# Patient Record
Sex: Female | Born: 2000 | Race: Black or African American | Hispanic: No | Marital: Single | Smoking: Never smoker
Health system: Southern US, Community
[De-identification: ages and names within clinical notes are randomized; demographics above are authoritative.]

## PROBLEM LIST (undated history)

## (undated) DIAGNOSIS — F319 Bipolar disorder, unspecified: Secondary | ICD-10-CM

## (undated) DIAGNOSIS — R51 Headache: Secondary | ICD-10-CM

## (undated) DIAGNOSIS — F329 Major depressive disorder, single episode, unspecified: Secondary | ICD-10-CM

## (undated) DIAGNOSIS — E669 Obesity, unspecified: Secondary | ICD-10-CM

## (undated) DIAGNOSIS — R519 Headache, unspecified: Secondary | ICD-10-CM

## (undated) DIAGNOSIS — F909 Attention-deficit hyperactivity disorder, unspecified type: Secondary | ICD-10-CM

## (undated) DIAGNOSIS — F32A Depression, unspecified: Secondary | ICD-10-CM

## (undated) DIAGNOSIS — J302 Other seasonal allergic rhinitis: Secondary | ICD-10-CM

## (undated) HISTORY — DX: Headache: R51

## (undated) HISTORY — DX: Headache, unspecified: R51.9

## (undated) HISTORY — PX: NO PAST SURGERIES: SHX2092

---
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
## 2019-07-19 ENCOUNTER — Other Ambulatory Visit: Payer: Self-pay

## 2019-07-19 ENCOUNTER — Encounter: Payer: Self-pay | Admitting: Advanced Practice Midwife

## 2019-07-19 ENCOUNTER — Ambulatory Visit (INDEPENDENT_AMBULATORY_CARE_PROVIDER_SITE_OTHER): Payer: Medicaid Other | Admitting: Advanced Practice Midwife

## 2019-07-19 VITALS — BP 136/79 | HR 90 | Wt 153.4 lb

## 2019-07-19 DIAGNOSIS — Z538 Procedure and treatment not carried out for other reasons: Secondary | ICD-10-CM | POA: Diagnosis not present

## 2019-07-19 DIAGNOSIS — Z30013 Encounter for initial prescription of injectable contraceptive: Secondary | ICD-10-CM

## 2019-07-19 DIAGNOSIS — Z3202 Encounter for pregnancy test, result negative: Secondary | ICD-10-CM | POA: Diagnosis not present

## 2019-07-19 DIAGNOSIS — Z3009 Encounter for other general counseling and advice on contraception: Secondary | ICD-10-CM

## 2019-07-19 LAB — POCT URINE PREGNANCY: Preg Test, Ur: NEGATIVE

## 2019-07-19 MED ORDER — MISOPROSTOL 200 MCG PO TABS: 400.0000 ug | ORAL_TABLET | Freq: Once | ORAL | 0 refills | Status: DC

## 2019-07-19 MED ORDER — MEDROXYPROGESTERONE ACETATE 150 MG/ML IM SUSP
150.0000 mg | Freq: Once | INTRAMUSCULAR | Status: AC
  Administered 2019-07-19: 150 mg via INTRAMUSCULAR

## 2019-07-19 NOTE — Progress Notes (Signed)
Pt requests to start Depo She has unprotected IC and last IC was on Saturday.  UPT neg Office supply Depo given RD w/o difficulty

## 2019-07-19 NOTE — Progress Notes (Signed)
  GYNECOLOGY PROGRESS NOTE  History:  19 y.o. G0 presents to [facility] office today for contraceptive gyn visit. She reports no gyn concerns or problems. She does not like taking OCPs, has to take meds for her bipolar and wants to take less medicine.  She would like lighter periods or no periods with her birth control.  She denies h/a, dizziness, shortness of breath, n/v, or fever/chills.    The following portions of the patient's history were reviewed and updated as appropriate: allergies, current medications, past family history, past medical history, past social history, past surgical history and problem list.   Review of Systems:  Pertinent items are noted in HPI.   Objective:  Physical Exam Blood pressure 136/79, pulse 90, weight 153 lb 6.4 oz (69.6 kg), last menstrual period 06/28/2019. VS reviewed, nursing note reviewed,  Constitutional: well developed, well nourished, no distress HEENT: normocephalic CV: normal rate Pulm/chest wall: normal effort Breast Exam: deferred Abdomen: soft Neuro: alert and oriented x 3 Skin: warm, dry Psych: affect normal Pelvic exam: Cervix pink, visually closed, without lesion, scant white creamy discharge, vaginal walls and external genitalia normal Bimanual exam: Cervix 0/long/high, firm, anterior, neg CMT, uterus nontender, nonenlarged, adnexa without tenderness, enlargement, or mass    Assessment & Plan:  1. Birth control counseling --UPT negative - POCT urine pregnancy - medroxyPROGESTERone (DEPO-PROVERA) injection 150 mg  2. Unsuccessful attempt to insert intrauterine device (IUD) --Pt tolerated procedure well.  Unable to insert/dilate internal os of cervix in office.  --Will reschedule for insertion with Rx for Cytotec  3. Encounter for counseling regarding contraception Discussed LARCs as most effective forms of birth control.  Discussed benefits/risks of other methods.  Pt desires Liletta IUD.  Unsuccessful attempt today.  Pt  interested in having IUD placed again, will prescribe Cytotec and reschedule.  Declines Depo or OCPs today, will use condoms if sexually active until IUD placed.   Sharen Counter, CNM 12:15 PM

## 2019-07-19 NOTE — Patient Instructions (Signed)
Contraception Choices Contraception, also called birth control, refers to methods or devices that prevent pregnancy. Hormonal methods Contraceptive implant  A contraceptive implant is a thin, plastic tube that contains a hormone. It is inserted into the upper part of the arm. It can remain in place for up to 3 years. Progestin-only injections Progestin-only injections are injections of progestin, a synthetic form of the hormone progesterone. They are given every 3 months by a health care provider. Birth control pills  Birth control pills are pills that contain hormones that prevent pregnancy. They must be taken once a day, preferably at the same time each day. Birth control patch  The birth control patch contains hormones that prevent pregnancy. It is placed on the skin and must be changed once a week for three weeks and removed on the fourth week. A prescription is needed to use this method of contraception. Vaginal ring  A vaginal ring contains hormones that prevent pregnancy. It is placed in the vagina for three weeks and removed on the fourth week. After that, the process is repeated with a new ring. A prescription is needed to use this method of contraception. Emergency contraceptive Emergency contraceptives prevent pregnancy after unprotected sex. They come in pill form and can be taken up to 5 days after sex. They work best the sooner they are taken after having sex. Most emergency contraceptives are available without a prescription. This method should not be used as your only form of birth control. Barrier methods Female condom  A female condom is a thin sheath that is worn over the penis during sex. Condoms keep sperm from going inside a woman's body. They can be used with a spermicide to increase their effectiveness. They should be disposed after a single use. Female condom  A female condom is a soft, loose-fitting sheath that is put into the vagina before sex. The condom keeps sperm  from going inside a woman's body. They should be disposed after a single use. Diaphragm  A diaphragm is a soft, dome-shaped barrier. It is inserted into the vagina before sex, along with a spermicide. The diaphragm blocks sperm from entering the uterus, and the spermicide kills sperm. A diaphragm should be left in the vagina for 6-8 hours after sex and removed within 24 hours. A diaphragm is prescribed and fitted by a health care provider. A diaphragm should be replaced every 1-2 years, after giving birth, after gaining more than 15 lb (6.8 kg), and after pelvic surgery. Cervical cap  A cervical cap is a round, soft latex or plastic cup that fits over the cervix. It is inserted into the vagina before sex, along with spermicide. It blocks sperm from entering the uterus. The cap should be left in place for 6-8 hours after sex and removed within 48 hours. A cervical cap must be prescribed and fitted by a health care provider. It should be replaced every 2 years. Sponge  A sponge is a soft, circular piece of polyurethane foam with spermicide on it. The sponge helps block sperm from entering the uterus, and the spermicide kills sperm. To use it, you make it wet and then insert it into the vagina. It should be inserted before sex, left in for at least 6 hours after sex, and removed and thrown away within 30 hours. Spermicides Spermicides are chemicals that kill or block sperm from entering the cervix and uterus. They can come as a cream, jelly, suppository, foam, or tablet. A spermicide should be inserted into the   vagina with an applicator at least 10-15 minutes before sex to allow time for it to work. The process must be repeated every time you have sex. Spermicides do not require a prescription. Intrauterine contraception Intrauterine device (IUD) An IUD is a T-shaped device that is put in a woman's uterus. There are two types:  Hormone IUD.This type contains progestin, a synthetic form of the hormone  progesterone. This type can stay in place for 3-5 years.  Copper IUD.This type is wrapped in copper wire. It can stay in place for 10 years.  Permanent methods of contraception Female tubal ligation In this method, a woman's fallopian tubes are sealed, tied, or blocked during surgery to prevent eggs from traveling to the uterus. Hysteroscopic sterilization In this method, a small, flexible insert is placed into each fallopian tube. The inserts cause scar tissue to form in the fallopian tubes and block them, so sperm cannot reach an egg. The procedure takes about 3 months to be effective. Another form of birth control must be used during those 3 months. Female sterilization This is a procedure to tie off the tubes that carry sperm (vasectomy). After the procedure, the man can still ejaculate fluid (semen). Natural planning methods Natural family planning In this method, a couple does not have sex on days when the woman could become pregnant. Calendar method This means keeping track of the length of each menstrual cycle, identifying the days when pregnancy can happen, and not having sex on those days. Ovulation method In this method, a couple avoids sex during ovulation. Symptothermal method This method involves not having sex during ovulation. The woman typically checks for ovulation by watching changes in her temperature and in the consistency of cervical mucus. Post-ovulation method In this method, a couple waits to have sex until after ovulation. Summary  Contraception, also called birth control, means methods or devices that prevent pregnancy.  Hormonal methods of contraception include implants, injections, pills, patches, vaginal rings, and emergency contraceptives.  Barrier methods of contraception can include female condoms, female condoms, diaphragms, cervical caps, sponges, and spermicides.  There are two types of IUDs (intrauterine devices). An IUD can be put in a woman's uterus to  prevent pregnancy for 3-5 years.  Permanent sterilization can be done through a procedure for males, females, or both.  Natural family planning methods involve not having sex on days when the woman could become pregnant. This information is not intended to replace advice given to you by your health care provider. Make sure you discuss any questions you have with your health care provider. Document Revised: 03/26/2017 Document Reviewed: 04/26/2016 Elsevier Patient Education  2020 [employer]  

## 2019-07-24 ENCOUNTER — Emergency Department (HOSPITAL_COMMUNITY)
Admission: EM | Admit: 2019-07-24 | Discharge: 2019-07-24 | Disposition: A | Discharge status: Home / Self Care | Payer: Medicaid Other | Attending: Emergency Medicine | Admitting: Emergency Medicine

## 2019-07-24 ENCOUNTER — Encounter (HOSPITAL_COMMUNITY): Payer: Self-pay | Admitting: Emergency Medicine

## 2019-07-24 ENCOUNTER — Other Ambulatory Visit: Payer: Self-pay

## 2019-07-24 DIAGNOSIS — F329 Major depressive disorder, single episode, unspecified: Secondary | ICD-10-CM | POA: Insufficient documentation

## 2019-07-24 DIAGNOSIS — F319 Bipolar disorder, unspecified: Secondary | ICD-10-CM | POA: Insufficient documentation

## 2019-07-24 DIAGNOSIS — R4589 Other symptoms and signs involving emotional state: Secondary | ICD-10-CM | POA: Diagnosis not present

## 2019-07-24 NOTE — ED Triage Notes (Signed)
Pt presents via [facility] for evaluation of bipolar with anxiety and suicidal ideation without plan. Pt calm and cooperative at this time.

## 2019-07-24 NOTE — ED Provider Notes (Signed)
[facility] Emergency Department Provider Note MRN:  [id number]  Arrival date & time: 07/24/19     Chief Complaint   Psychiatric Evaluation   History of Present Illness   Brittany [name] is a 19 y.o. year-old female with a history of bipolar disorder presenting to the ED with chief complaint of psychiatric evaluation.  Patient explains that shortly prior to arrival she had this sudden feeling that she wanted to self-harm in the form of cutting herself.  She has done this multiple times in the past.  She denies SI, no HI, no AVH.  The feeling passed and she never wants to hurt her self.  Mother called police during this event and she was brought here for evaluation.  She denies any pain, no injuries, no drugs, no alcohol, no overdose.  Mother confirms the story.  Review of Systems  A complete 10 system review of systems was obtained and all systems are negative except as noted in the HPI and PMH.   Patient's Health History    Past Medical History:  Diagnosis Date  . ADHD (attention deficit hyperactivity disorder)   . Attention deficit hyperactivity disorder (ADHD) 08/01/2015  . Bipolar 1 disorder ([place])   . Bipolar disorder ([place])   . Depression   . Depressive disorder 08/01/2015  . Headache   . Seasonal allergies     Past Surgical History:  Procedure Laterality Date  . NO PAST SURGERIES      Family History  Adopted: Yes  Problem Relation Age of Onset  . HIV Mother        Died at 47    Social History   Socioeconomic History  . Marital status: Single    Spouse name: Not on file  . Number of children: Not on file  . Years of education: Not on file  . Highest education level: Not on file  Occupational History  . Not on file  Tobacco Use  . Smoking status: Never Smoker  . Smokeless tobacco: Never Used  Substance and Sexual Activity  . Alcohol use: No    Alcohol/week: 0.0 standard drinks  . Drug use: No  . Sexual activity: Not Currently    Other Topics Concern  . Not on file  Social History Narrative   Katie is in ninth grade at [employer]. She is struggling. She enjoys singing, dancing, shopping.   Living with her parents.   Social Determinants of Health   Financial Resource Strain:   . Difficulty of Paying Living Expenses:   Food Insecurity:   . Worried About Charity fundraiser in the Last Year:   . Arboriculturist in the Last Year:   Transportation Needs:   . Film/video editor (Medical):   Marland Kitchen Lack of Transportation (Non-Medical):   Physical Activity:   . Days of Exercise per Week:   . Minutes of Exercise per Session:   Stress:   . Feeling of Stress :   Social Connections:   . Frequency of Communication with Friends and Family:   . Frequency of Social Gatherings with Friends and Family:   . Attends Religious Services:   . Active Member of Clubs or Organizations:   . Attends Archivist Meetings:   Marland Kitchen Marital Status:   Intimate Partner Violence:   . Fear of Current or Ex-Partner:   . Emotionally Abused:   Marland Kitchen Physically Abused:   . Sexually Abused:      Physical Exam  Vitals:   07/24/19 1941  BP: (!) 105/92  Pulse: 97  Resp: 14  Temp: 99 F (37.2 C)  SpO2: 98%    CONSTITUTIONAL: Well-appearing, NAD NEURO:  Alert and oriented x 3, no focal deficits EYES:  eyes equal and reactive ENT/NECK:  no LAD, no JVD CARDIO: Regular rate, well-perfused, normal S1 and S2 PULM:  CTAB no wheezing or rhonchi GI/GU:  normal bowel sounds, non-distended, non-tender MSK/SPINE:  No gross deformities, no edema SKIN:  no rash, atraumatic PSYCH:  Appropriate speech and behavior  *Additional and/or pertinent findings included in MDM below  Diagnostic and Interventional Summary    EKG Interpretation  Date/Time:    Ventricular Rate:    PR Interval:    QRS Duration:   QT Interval:    QTC Calculation:   R Axis:     Text Interpretation:        Labs Reviewed - No data to  display  No orders to display    Medications - No data to display   Procedures  /  Critical Care Procedures  ED Course and Medical Decision Making  I have reviewed the triage vital signs, the nursing notes, and pertinent available records from the EMR.  Listed above are laboratory and imaging tests that I personally ordered, reviewed, and interpreted and then considered in my medical decision making (see below for details).      Brief feelings of wanting to self-harm but no injuries, no SI, no HI, no AVH.  Has had similar presentations.  At this point patient is not a imminent threat of harm to self or others.  I obtained collateral information from patient's mother, who confirms the story and is comfortable with patient returning home.  I contracted the patient for safety, she promises to return to the emergency department or seek help prior to acting on any thoughts of self-harm.  Has follow-up with [facility].    Elmer Sow. Pilar Plate, MD [facility] Emergency Medicine [facility] [email or web address]  Final Clinical Impressions(s) / ED Diagnoses     ICD-10-CM   1. Thoughts of self harm  R45.89     ED Discharge Orders    None       Discharge Instructions Discussed with and Provided to Patient:     Discharge Instructions     You were evaluated in the Emergency Department and after careful evaluation, we did not find any emergent condition requiring admission or further testing in the hospital.  Your exam/testing today was overall reassuring.  Please return to the Emergency Department if you experience any worsening of your condition.  We encourage you to follow up with a primary care provider.  Thank you for allowing [place] to be a part of your care.       Sabas Sous, MD 07/24/19 2031

## 2019-07-24 NOTE — Discharge Instructions (Addendum)
You were evaluated in the Emergency Department and after careful evaluation, we did not find any emergent condition requiring admission or further testing in the hospital. ° °Your exam/testing today was overall reassuring. ° °Please return to the Emergency Department if you experience any worsening of your condition.  We encourage you to follow up with a primary care provider.  Thank you for allowing us to be a part of your care. ° °

## 2019-07-31 ENCOUNTER — Other Ambulatory Visit: Payer: Self-pay

## 2019-07-31 ENCOUNTER — Emergency Department (HOSPITAL_COMMUNITY)
Admission: EM | Admit: 2019-07-31 | Discharge: 2019-08-01 | Disposition: A | Discharge status: Home / Self Care | Payer: Medicaid Other | Attending: Emergency Medicine | Admitting: Emergency Medicine

## 2019-07-31 ENCOUNTER — Encounter (HOSPITAL_COMMUNITY): Payer: Self-pay

## 2019-07-31 DIAGNOSIS — F419 Anxiety disorder, unspecified: Secondary | ICD-10-CM | POA: Diagnosis present

## 2019-07-31 DIAGNOSIS — Z79899 Other long term (current) drug therapy: Secondary | ICD-10-CM | POA: Insufficient documentation

## 2019-07-31 DIAGNOSIS — F319 Bipolar disorder, unspecified: Secondary | ICD-10-CM | POA: Diagnosis not present

## 2019-07-31 DIAGNOSIS — F3481 Disruptive mood dysregulation disorder: Secondary | ICD-10-CM

## 2019-07-31 LAB — CBC WITH DIFFERENTIAL/PLATELET
Abs Immature Granulocytes: 0.02 10*3/uL (ref 0.00–0.07)
Basophils Absolute: 0.1 10*3/uL (ref 0.0–0.1)
Basophils Relative: 1 %
Eosinophils Absolute: 0.1 10*3/uL (ref 0.0–0.5)
Eosinophils Relative: 1 %
HCT: 42.5 % (ref 36.0–46.0)
Hemoglobin: 13.6 g/dL (ref 12.0–15.0)
Immature Granulocytes: 0 %
Lymphocytes Relative: 39 %
Lymphs Abs: 2.5 10*3/uL (ref 0.7–4.0)
MCH: 28.5 pg (ref 26.0–34.0)
MCHC: 32 g/dL (ref 30.0–36.0)
MCV: 89.1 fL (ref 80.0–100.0)
Monocytes Absolute: 0.5 10*3/uL (ref 0.1–1.0)
Monocytes Relative: 8 %
Neutro Abs: 3.4 10*3/uL (ref 1.7–7.7)
Neutrophils Relative %: 51 %
Platelets: 346 10*3/uL (ref 150–400)
RBC: 4.77 MIL/uL (ref 3.87–5.11)
RDW: 14.9 % (ref 11.5–15.5)
WBC: 6.6 10*3/uL (ref 4.0–10.5)
nRBC: 0 % (ref 0.0–0.2)

## 2019-07-31 LAB — COMPREHENSIVE METABOLIC PANEL
ALT: 15 U/L (ref 0–44)
AST: 17 U/L (ref 15–41)
Albumin: 4.6 g/dL (ref 3.5–5.0)
Alkaline Phosphatase: 54 U/L (ref 38–126)
Anion gap: 10 (ref 5–15)
BUN: 12 mg/dL (ref 6–20)
CO2: 20 mmol/L — ABNORMAL LOW (ref 22–32)
Calcium: 9.3 mg/dL (ref 8.9–10.3)
Chloride: 109 mmol/L (ref 98–111)
Creatinine, Ser: 0.7 mg/dL (ref 0.44–1.00)
GFR calc Af Amer: >60 mL/min (ref >60)
GFR calc non Af Amer: >60 mL/min (ref >60)
Glucose, Bld: 105 mg/dL — ABNORMAL HIGH (ref 70–99)
Potassium: 3.8 mmol/L (ref 3.5–5.1)
Sodium: 139 mmol/L (ref 135–145)
Total Bilirubin: 0.3 mg/dL (ref 0.3–1.2)
Total Protein: 8 g/dL (ref 6.5–8.1)

## 2019-07-31 LAB — RAPID URINE DRUG SCREEN, HOSP PERFORMED
Amphetamines: NOT DETECTED
Barbiturates: NOT DETECTED
Benzodiazepines: NOT DETECTED
Cocaine: NOT DETECTED
Opiates: NOT DETECTED
Tetrahydrocannabinol: NOT DETECTED

## 2019-07-31 LAB — ETHANOL: Alcohol, Ethyl (B): <10 mg/dL (ref <10)

## 2019-07-31 LAB — I-STAT BETA HCG BLOOD, ED (MC, WL, AP ONLY): I-stat hCG, quantitative: <5 m[IU]/mL (ref <5)

## 2019-07-31 MED ORDER — ARIPIPRAZOLE 10 MG PO TABS: 10.0000 mg | ORAL_TABLET | ORAL | Status: DC

## 2019-07-31 MED ORDER — HYDROXYZINE HCL 25 MG PO TABS: 25.0000 mg | ORAL_TABLET | Freq: Once | ORAL | Status: DC

## 2019-07-31 MED ORDER — LORAZEPAM 1 MG PO TABS: 1.0000 mg | ORAL_TABLET | ORAL | Status: DC | PRN

## 2019-07-31 MED ORDER — PROPRANOLOL HCL 20 MG PO TABS
60.0000 mg | ORAL_TABLET | Freq: Every day | ORAL | Status: DC
  Filled 2019-07-31: qty 3

## 2019-07-31 MED ORDER — TRAZODONE HCL 50 MG PO TABS
150.0000 mg | ORAL_TABLET | Freq: Every day | ORAL | Status: DC
  Filled 2019-07-31: qty 1

## 2019-07-31 MED ORDER — ACETAMINOPHEN 500 MG PO TABS
1000.0000 mg | ORAL_TABLET | Freq: Four times a day (QID) | ORAL | Status: DC | PRN
  Administered 2019-08-01: 1000 mg via ORAL
  Filled 2019-07-31: qty 2

## 2019-07-31 MED ORDER — PANTOPRAZOLE SODIUM 40 MG PO TBEC: 40.0000 mg | DELAYED_RELEASE_TABLET | Freq: Every day | ORAL | Status: DC

## 2019-07-31 NOTE — BHH Counselor (Signed)
Clinician called spoke to [facility] in triage expressing she called the TTS cart within 10 minutes however no one answered. Jean Rosenthal reported, he will find the TTS cart for pt's assessment. Clinician expressed she will call the TTS cart in 10 more minutes.    Redmond Pulling, MS, [facility], [facility] Triage Specialist [phone]

## 2019-07-31 NOTE — BHH Counselor (Signed)
Clinician to call TTS cart in 10 minutes. Spoke to [place], Charity fundraiser.    Redmond Pulling, MS, [facility], [facility] Triage Specialist [phone]

## 2019-07-31 NOTE — ED Provider Notes (Signed)
[facility]-EMERGENCY DEPT Provider Note   CSN: [id number] Arrival date & time: 07/31/19  1855     History Chief Complaint  Patient presents with  . Anxiety    DEEANNA BEIGHTOL is a 19 y.o. female hx of ADHD, bipolar, here presenting with anxiety, depressed mood .  Patient was seen in the ED about a week ago for suicidal ideation.  Patient at that time was able to contract for safety.  Patient was told to follow-up with [facility] but she did not.  Patient apparently went to a friend's house and then came back today and had racing thoughts. Patient states that she is very sad but denies any obvious suicidal ideations. Patient states that she is feeling anxious as well.  Patient has previous psychiatric admissions.  Patient states that she is compliant with her medicines and denies any overdose.  Patient also has some chronic abdominal pain and had recent GI evaluation and thought to have hemorrhoids.    I discussed with the mother, she states that her daughter has not been straightforward with her answers.  She is concerned that she is becoming more manic requesting psychiatric evaluation.  The history is provided by the patient.       Past Medical History:  Diagnosis Date  . ADHD (attention deficit hyperactivity disorder)   . Attention deficit hyperactivity disorder (ADHD) 08/01/2015  . Bipolar 1 disorder ([facility])   . Bipolar disorder ([facility])   . Depression   . Depressive disorder 08/01/2015  . Headache   . Seasonal allergies     Patient Active Problem List   Diagnosis Date Noted  . Defiant behavior 07/06/2018  . Attention-deficit hyperactivity disorder, predominantly hyperactive type   . Homicidal ideation   . Attention deficit hyperactivity disorder (ADHD) 08/01/2015  . Depressive disorder 08/01/2015  . DMDD (disruptive mood dysregulation disorder) ([facility]) 07/26/2015  . Tension headache 05/23/2014  . Depression 05/23/2014  . Anxiety state 05/23/2014     Past Surgical History:  Procedure Laterality Date  . NO PAST SURGERIES       OB History   No obstetric history on file.     Family History  Adopted: Yes  Problem Relation Age of Onset  . HIV Mother        Died at 34    Social History   Tobacco Use  . Smoking status: Never Smoker  . Smokeless tobacco: Never Used  Substance Use Topics  . Alcohol use: No    Alcohol/week: 0.0 standard drinks  . Drug use: No    Home Medications Prior to Admission medications   Medication Sig Start Date End Date Taking? Authorizing Provider  acetaminophen (TYLENOL) 500 MG tablet Take 1,000 mg by mouth every 6 (six) hours as needed for mild pain or moderate pain.    [provider]  ARIPiprazole (ABILIFY) 10 MG tablet Take 10 mg by mouth every morning.    [provider]  ARIPiprazole (ABILIFY) 20 MG tablet Take 20 mg by mouth at bedtime.  04/29/18   [provider]  cholecalciferol (VITAMIN D3) 25 MCG (1000 UT) tablet Take 1,000 Units by mouth daily.    [provider]  clindamycin-benzoyl peroxide (BENZACLIN) gel Apply 1 application topically every evening. 04/28/18   [provider]  cyclobenzaprine (FLEXERIL) 5 MG tablet Take 1 tablet (5 mg total) by mouth 3 (three) times daily as needed for muscle spasms. Patient not taking: Reported on 07/19/2019 06/23/18   Charlynne Pander, MD  lithium carbonate 300 MG capsule Take 600 mg by mouth 2 (two) times daily with a meal.     [provider]  Methylphenidate HCl ER, XR, (APTENSIO XR) 30 MG CP24 Take 30 mg by mouth every morning.    [provider]  misoprostol (CYTOTEC) 200 MCG tablet Take 2 tablets (400 mcg total) by mouth once for 1 dose. Take both tablets by mouth the night before your procedure. 07/19/19 07/19/19  Leftwich-Kirby, Wilmer Floor, CNM  Multiple Vitamins-Minerals (MULTIVITAMIN GUMMIES ADULT PO) Take 1 tablet by mouth every morning.    [provider]  naproxen  (NAPROSYN) 500 MG tablet Take 1 tablet (500 mg total) by mouth 2 (two) times daily with a meal. As needed for pain Patient not taking: Reported on 07/19/2019 06/07/18   Linwood Dibbles, MD  Norethindrone-Ethinyl Estradiol-Fe Biphas (LO LOESTRIN FE) 1 MG-10 MCG / 10 MCG tablet Take 1 tablet by mouth daily. Patient not taking: Reported on 07/19/2019 03/01/18   Sharen Counter A, CNM  pantoprazole (PROTONIX) 40 MG tablet Take 40 mg by mouth daily after breakfast.    [provider]  propranolol (INDERAL) 60 MG tablet Take 60 mg by mouth at bedtime.    [provider]  traZODone (DESYREL) 150 MG tablet Take 150 mg by mouth at bedtime. 04/16/18   [provider]    Allergies    Patient has no known allergies.  Review of Systems   Review of Systems  Psychiatric/Behavioral: Positive for dysphoric mood. The patient is nervous/anxious.   All other systems reviewed and are negative.   Physical Exam Updated Vital Signs BP 135/70 (BP Location: Left Arm)   Pulse 76   Temp 98.6 F (37 C) (Oral)   Resp 16   SpO2 98%   Physical Exam Vitals and nursing note reviewed.  Constitutional:      Comments: Anxious, depressed   HENT:     Head: Normocephalic.     Nose: Nose normal.     Mouth/Throat:     Mouth: Mucous membranes are moist.  Eyes:     Extraocular Movements: Extraocular movements intact.     Pupils: Pupils are equal, round, and reactive to light.  Cardiovascular:     Rate and Rhythm: Normal rate and regular rhythm.     Pulses: Normal pulses.     Heart sounds: Normal heart sounds.  Pulmonary:     Effort: Pulmonary effort is normal.     Breath sounds: Normal breath sounds.  Abdominal:     General: Abdomen is flat.     Palpations: Abdomen is soft.  Musculoskeletal:     Cervical back: Normal range of motion.  Skin:    General: Skin is warm.  Neurological:     General: No focal deficit present.  Psychiatric:     Comments: Tearful, avoid eye contact       ED Results / Procedures / Treatments   Labs (all labs ordered are listed, but only abnormal results are displayed) Labs Reviewed  CBC WITH DIFFERENTIAL/PLATELET  COMPREHENSIVE METABOLIC PANEL  ETHANOL  RAPID URINE DRUG SCREEN, HOSP PERFORMED  I-STAT BETA HCG BLOOD, ED ([facility], [facility], [facility] ONLY)    EKG None  Radiology No results found.  Procedures Procedures (including critical care time)  Medications Ordered in ED Medications  hydrOXYzine (ATARAX/VISTARIL) tablet 25 mg (has no administration in time range)    ED Course  I have reviewed the triage vital signs and the nursing notes.  Pertinent labs & imaging  results that were available during my care of the patient were reviewed by me and considered in my medical decision making (see chart for details).    MDM Rules/Calculators/A&P                      IXCHEL DUCK is a 19 y.o. female here presenting with anxiety, chronic abdominal pain, flights of ideas.  Patient's abdominal pain is chronic and has been evaluated by GI in the past .  Patient was seen here about a week ago for anxiety and suicidal ideation. She denies any suicidal ideation right now but she seemed very vague with her answers and mother was concerned that she may actually harm herself if she is sent home.  Will get psych clearance labs and consult TTS.   11:28 PM TTS recommend observation overnight and will reassess in the morning.  Psych clearance labs unremarkable.  Home meds ordered.    Final Clinical Impression(s) / ED Diagnoses Final diagnoses:  None    Rx / DC Orders ED Discharge Orders    None       Drenda Freeze, MD 07/31/19 2328

## 2019-07-31 NOTE — ED Triage Notes (Signed)
Pt reports worsening anxiety today. Denies SI/HI. Tearful during triage. States that her thoughts are racing.
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
## 2019-07-31 NOTE — BHH Counselor (Signed)
Clinician called TTS cart within 10 minutes however no one answered.    Redmond Pulling, MS, [facility], [facility] Triage Specialist [phone]

## 2019-08-01 MED ORDER — ACETAMINOPHEN 325 MG PO TABS: 650.0000 mg | ORAL_TABLET | Freq: Once | ORAL | Status: DC

## 2019-08-01 NOTE — BH Assessment (Signed)
BHH Assessment Progress Note  Per Nelly Rout, MD, this pt does not require psychiatric hospitalization at this time.  Pt is to be discharged from [facility] with recommendation to continue treatment with pt's current outpatient provider.  This has been included in pt's discharge instructions.  Pt's nurse has been notified.  Doylene Canning, MA Triage Specialist [phone]

## 2019-08-01 NOTE — ED Notes (Signed)
Pt refused ordered medication. She states that she "doesn't take that anymore."

## 2019-08-01 NOTE — Discharge Instructions (Signed)
The psychiatry team felt you are safe for discharge home today.  Please follow-up with your outpatient psychiatry team for further management.  If any symptoms change or worsen, please return to nearest emergency department.

## 2019-08-01 NOTE — Consult Note (Signed)
                                          Psychiatric note for discharge  Patient is a 19 year old female who presents to the ED for increased anxiety.  Patient reports that she is doing better with anxiety, was not suicidal when she presented to the ED, is not homicidal or psychotic.  Patient reports that she lives with her parents, feels she is back at her baseline can return home.  She also adds that she sees an outpatient psychiatrist in [place], takes her medications as prescribed and is okay with returning back to her psychiatrist.  Patient to be discharged in care of parents

## 2019-08-01 NOTE — ED Provider Notes (Signed)
Psychiatry culture report that patient is now psychiatrically clear.  They feel she is safe for discharge home with outpatient follow-up.  They requested we get the patient ready for discharge.  Patient be discharged for outpatient follow-up for mental health problems.  When I assessed the patient personally, she had no complaints and breath sounds were clear and chest and back were nontender.  Patient will be discharged for outpatient follow-up.  Clinical Impression: 1. Disruptive mood dysregulation disorder (HCC) Chronic    Disposition: Discharge  Condition: Good  I have discussed the results, Dx and Tx plan with the pt(& family if present). He/she/they expressed understanding and agree(s) with the plan. Discharge instructions discussed at great length. Strict return precautions discussed and pt &/or family have verbalized understanding of the instructions. No further questions at time of discharge.    New Prescriptions   No medications on file    Follow Up: No follow-up provider specified.    Brittany [name], Canary Brim, MD 08/01/19 1105

## 2019-08-05 ENCOUNTER — Ambulatory Visit (INDEPENDENT_AMBULATORY_CARE_PROVIDER_SITE_OTHER): Payer: Medicaid Other | Admitting: Family Medicine

## 2019-08-05 ENCOUNTER — Other Ambulatory Visit: Payer: Self-pay

## 2019-08-05 VITALS — BP 127/84 | HR 96 | Ht 62.0 in | Wt 155.1 lb

## 2019-08-05 DIAGNOSIS — Z30430 Encounter for insertion of intrauterine contraceptive device: Secondary | ICD-10-CM | POA: Diagnosis not present

## 2019-08-05 DIAGNOSIS — Z3202 Encounter for pregnancy test, result negative: Secondary | ICD-10-CM | POA: Diagnosis not present

## 2019-08-05 LAB — POCT URINE PREGNANCY: Preg Test, Ur: NEGATIVE

## 2019-08-05 MED ORDER — LEVONORGESTREL 19.5 MCG/DAY IU IUD: INTRAUTERINE_SYSTEM | Freq: Once | INTRAUTERINE | Status: AC

## 2019-08-05 NOTE — Progress Notes (Signed)
    GYNECOLOGY OFFICE PROCEDURE NOTE  Brittany [name] is a 19 y.o. No obstetric history on file. here for Liletta IUD insertion. No GYN concerns.  IUD Insertion Procedure Note Patient identified, informed consent performed, consent signed.   Discussed risks of irregular bleeding, cramping, infection, malpositioning or misplacement of the IUD outside the uterus which may require further procedure such as laparoscopy. Also discussed >99% contraception efficacy, increased risk of ectopic pregnancy with failure of method.  Time out was performed. Urine pregnancy test negative.  Speculum placed in the vagina.  Cervix visualized. Cleaned with Betadine x 2.  Grasped anteriorly with a single tooth tenaculum. Uterus sounded to 8.5 cm.  Liletta IUD placed per manufacturer's recommendations. Strings trimmed to 3 cm. Tenaculum was removed, good hemostasis noted.Patient tolerated procedure well.   Patient was given post-procedure instructions. She was advised to have backup contraception for one week.  Patient was also asked to check IUD strings periodically and follow up in 4 weeks for IUD check.  [name], MD 08/05/2019 11:42 AM

## 2019-08-05 NOTE — Progress Notes (Signed)
upt 

## 2019-08-05 NOTE — Patient Instructions (Signed)

## 2019-08-15 ENCOUNTER — Emergency Department (HOSPITAL_COMMUNITY): Payer: Medicaid Other

## 2019-08-15 ENCOUNTER — Emergency Department (HOSPITAL_COMMUNITY)
Admission: EM | Admit: 2019-08-15 | Discharge: 2019-08-16 | Disposition: A | Discharge status: Home / Self Care | Payer: Medicaid Other | Attending: Emergency Medicine | Admitting: Emergency Medicine

## 2019-08-15 ENCOUNTER — Other Ambulatory Visit: Payer: Self-pay

## 2019-08-15 ENCOUNTER — Encounter (HOSPITAL_COMMUNITY): Payer: Self-pay | Admitting: Emergency Medicine

## 2019-08-15 DIAGNOSIS — Z79899 Other long term (current) drug therapy: Secondary | ICD-10-CM | POA: Insufficient documentation

## 2019-08-15 DIAGNOSIS — F909 Attention-deficit hyperactivity disorder, unspecified type: Secondary | ICD-10-CM | POA: Diagnosis not present

## 2019-08-15 DIAGNOSIS — R102 Pelvic and perineal pain: Secondary | ICD-10-CM | POA: Insufficient documentation

## 2019-08-15 DIAGNOSIS — N939 Abnormal uterine and vaginal bleeding, unspecified: Secondary | ICD-10-CM | POA: Diagnosis not present

## 2019-08-15 LAB — COMPREHENSIVE METABOLIC PANEL
ALT: 12 U/L (ref 0–44)
AST: 14 U/L — ABNORMAL LOW (ref 15–41)
Albumin: 5.3 g/dL — ABNORMAL HIGH (ref 3.5–5.0)
Alkaline Phosphatase: 61 U/L (ref 38–126)
Anion gap: 13 (ref 5–15)
BUN: 18 mg/dL (ref 6–20)
CO2: 22 mmol/L (ref 22–32)
Calcium: 9.8 mg/dL (ref 8.9–10.3)
Chloride: 106 mmol/L (ref 98–111)
Creatinine, Ser: 1.02 mg/dL — ABNORMAL HIGH (ref 0.44–1.00)
GFR calc Af Amer: >60 mL/min (ref >60)
GFR calc non Af Amer: >60 mL/min (ref >60)
Glucose, Bld: 76 mg/dL (ref 70–99)
Potassium: 4.1 mmol/L (ref 3.5–5.1)
Sodium: 141 mmol/L (ref 135–145)
Total Bilirubin: 0.8 mg/dL (ref 0.3–1.2)
Total Protein: 9.2 g/dL — ABNORMAL HIGH (ref 6.5–8.1)

## 2019-08-15 LAB — CBC
HCT: 45.5 % (ref 36.0–46.0)
Hemoglobin: 14.5 g/dL (ref 12.0–15.0)
MCH: 28.4 pg (ref 26.0–34.0)
MCHC: 31.9 g/dL (ref 30.0–36.0)
MCV: 89 fL (ref 80.0–100.0)
Platelets: 348 10*3/uL (ref 150–400)
RBC: 5.11 MIL/uL (ref 3.87–5.11)
RDW: 14.5 % (ref 11.5–15.5)
WBC: 7.9 10*3/uL (ref 4.0–10.5)
nRBC: 0 % (ref 0.0–0.2)

## 2019-08-15 LAB — URINALYSIS, ROUTINE W REFLEX MICROSCOPIC
Bilirubin Urine: NEGATIVE
Glucose, UA: NEGATIVE mg/dL
Ketones, ur: 80 mg/dL — AB
Nitrite: NEGATIVE
Protein, ur: 100 mg/dL — AB
RBC / HPF: >50 RBC/hpf — ABNORMAL HIGH (ref 0–5)
Specific Gravity, Urine: 1.03 (ref 1.005–1.030)
pH: 6 (ref 5.0–8.0)

## 2019-08-15 LAB — HCG, QUANTITATIVE, PREGNANCY: hCG, Beta Chain, Quant, S: <1 m[IU]/mL (ref <5)

## 2019-08-15 LAB — LIPASE, BLOOD: Lipase: 35 U/L (ref 11–51)

## 2019-08-15 MED ORDER — MEGESTROL ACETATE 20 MG PO TABS
20.0000 mg | ORAL_TABLET | Freq: Every day | ORAL | 0 refills | Status: AC
Start: 2019-08-15 — End: 2019-08-20

## 2019-08-15 MED ORDER — HYDROCODONE-ACETAMINOPHEN 5-325 MG PO TABS
1.0000 | ORAL_TABLET | Freq: Once | ORAL | Status: AC
  Administered 2019-08-15: 1 via ORAL
  Filled 2019-08-15: qty 1

## 2019-08-15 MED ORDER — SODIUM CHLORIDE 0.9% FLUSH: 3.0000 mL | Freq: Once | INTRAVENOUS | Status: DC

## 2019-08-15 NOTE — ED Provider Notes (Signed)
[facility]-EMERGENCY DEPT Provider Note   CSN: [id number] Arrival date & time: 08/15/19  1433     History Chief Complaint  Patient presents with  . Vaginal Bleeding    Brittany [name] is a 19 y.o. female.  19 year old female presents with complaint of left lower quadrant pain with vaginal bleeding.  Patient states her symptoms started on April 23 after placement of an IUD.  Pain radiates across her low pelvis, is constant, reports bleeding through 1 pad every hour with clots.  Denies history of STDs or concern for possible STD today.  Denies nausea, vomiting, changes in bowel or bladder habits.  She is taking naproxen and Tylenol without improvement in her pain.  No other complaints or concerns today.        Past Medical History:  Diagnosis Date  . ADHD (attention deficit hyperactivity disorder)   . Attention deficit hyperactivity disorder (ADHD) 08/01/2015  . Bipolar 1 disorder (HCC)   . Bipolar disorder (HCC)   . Depression   . Depressive disorder 08/01/2015  . Headache   . Seasonal allergies     Patient Active Problem List   Diagnosis Date Noted  . Defiant behavior 07/06/2018  . Attention-deficit hyperactivity disorder, predominantly hyperactive type   . Homicidal ideation   . Attention deficit hyperactivity disorder (ADHD) 08/01/2015  . Depressive disorder 08/01/2015  . DMDD (disruptive mood dysregulation disorder) (HCC) 07/26/2015  . Tension headache 05/23/2014  . Depression 05/23/2014  . Anxiety state 05/23/2014    Past Surgical History:  Procedure Laterality Date  . NO PAST SURGERIES       OB History   No obstetric history on file.     Family History  Adopted: Yes  Problem Relation Age of Onset  . HIV Mother        Died at 51    Social History   Tobacco Use  . Smoking status: Never Smoker  . Smokeless tobacco: Never Used  Substance Use Topics  . Alcohol use: No    Alcohol/week: 0.0 standard drinks  . Drug use: No     Home Medications Prior to Admission medications   Medication Sig Start Date End Date Taking? Authorizing Provider  ARIPiprazole (ABILIFY) 20 MG tablet Take 20 mg by mouth at bedtime.  04/29/18  Yes [provider]  lamoTRIgine (LAMICTAL) 25 MG tablet Take 50 mg by mouth daily.  06/07/19  Yes [provider]  omeprazole (PRILOSEC) 10 MG capsule Take 10 mg by mouth daily.   Yes [provider]  QUEtiapine (SEROQUEL) 100 MG tablet Take 100 mg by mouth at bedtime.  06/06/19  Yes [provider]  cyclobenzaprine (FLEXERIL) 5 MG tablet Take 1 tablet (5 mg total) by mouth 3 (three) times daily as needed for muscle spasms. Patient not taking: Reported on 07/19/2019 06/23/18   Charlynne Pander, MD  megestrol (MEGACE) 20 MG tablet Take 1 tablet (20 mg total) by mouth daily for 5 days. 08/15/19 08/20/19  Jeannie Fend, PA-C  misoprostol (CYTOTEC) 200 MCG tablet Take 2 tablets (400 mcg total) by mouth once for 1 dose. Take both tablets by mouth the night before your procedure. Patient not taking: Reported on 08/16/2019 07/19/19 07/19/19  Leftwich-Kirby, Wilmer Floor, CNM  naproxen (NAPROSYN) 500 MG tablet Take 1 tablet (500 mg total) by mouth 2 (two) times daily with a meal. As needed for pain Patient not taking: Reported on 07/19/2019 06/07/18   Linwood Dibbles, MD  Norethindrone-Ethinyl Estradiol-Fe Biphas (LO  LOESTRIN FE) 1 MG-10 MCG / 10 MCG tablet Take 1 tablet by mouth daily. Patient not taking: Reported on 08/05/2019 03/01/18   Elvera Maria, CNM    Allergies    Patient has no known allergies.  Review of Systems   Review of Systems  Constitutional: Negative for fever.  Respiratory: Negative for shortness of breath.   Cardiovascular: Negative for chest pain.  Gastrointestinal: Positive for abdominal pain. Negative for constipation, diarrhea, nausea and vomiting.  Genitourinary: Positive for pelvic pain and vaginal bleeding. Negative for dysuria, frequency and vaginal  discharge.  Musculoskeletal: Negative for arthralgias, back pain and myalgias.  Skin: Negative for rash and wound.  Allergic/Immunologic: Negative for immunocompromised state.  Neurological: Negative for dizziness and weakness.  Hematological: Negative for adenopathy.  All other systems reviewed and are negative.   Physical Exam Updated Vital Signs BP 130/67   Pulse 75   Temp 99.6 F (37.6 C) (Oral)   Resp 16   SpO2 100%   Physical Exam Vitals and nursing note reviewed. Exam conducted with a chaperone present.  Constitutional:      General: She is not in acute distress.    Appearance: She is well-developed. She is not diaphoretic.  HENT:     Head: Normocephalic and atraumatic.  Cardiovascular:     Rate and Rhythm: Normal rate and regular rhythm.     Pulses: Normal pulses.     Heart sounds: Normal heart sounds.  Pulmonary:     Effort: Pulmonary effort is normal.     Breath sounds: Normal breath sounds.  Abdominal:     Palpations: Abdomen is soft.     Tenderness: There is abdominal tenderness in the suprapubic area, left upper quadrant and left lower quadrant. There is no right CVA tenderness or left CVA tenderness.  Genitourinary:    Adnexa:        Right: No mass.         Left: No mass.       Comments: Trace amount of bleeding from os, IUD string visible.  Generalized/diffuse pelvic pain. Skin:    General: Skin is warm and dry.     Findings: No erythema or rash.  Neurological:     Mental Status: She is alert and oriented to person, place, and time.  Psychiatric:        Behavior: Behavior normal.     ED Results / Procedures / Treatments   Labs (all labs ordered are listed, but only abnormal results are displayed) Labs Reviewed  WET PREP, GENITAL - Abnormal; Notable for the following components:      Result Value   Clue Cells Wet Prep HPF POC PRESENT (*)    WBC, Wet Prep HPF POC MODERATE (*)    All other components within normal limits  COMPREHENSIVE METABOLIC  PANEL - Abnormal; Notable for the following components:   Creatinine, Ser 1.02 (*)    Total Protein 9.2 (*)    Albumin 5.3 (*)    AST 14 (*)    All other components within normal limits  URINALYSIS, ROUTINE W REFLEX MICROSCOPIC - Abnormal; Notable for the following components:   APPearance HAZY (*)    Hgb urine dipstick LARGE (*)    Ketones, ur 80 (*)    Protein, ur 100 (*)    Leukocytes,Ua TRACE (*)    RBC / HPF >50 (*)    Bacteria, UA RARE (*)    All other components within normal limits  LIPASE, BLOOD  CBC  HCG, QUANTITATIVE, PREGNANCY  GC/CHLAMYDIA PROBE AMP ([place]) NOT AT [facility]    EKG None  Radiology US Transvaginal Non-OB  Result Date: 08/15/2019 CLINICAL DATA:  Left lower quadrant pain and heavy bleeding EXAM: TRANSABDOMINAL ULTRASOUND OF PELVIS DOPPLER ULTRASOUND OF OVARIES TECHNIQUE: Transabdominal ultrasound examination of the pelvis was performed including evaluation of the uterus, ovaries, adnexal regions, and pelvic cul-de-sac. Color and duplex Doppler ultrasound was utilized to evaluate blood flow to the ovaries. COMPARISON:  None. FINDINGS: Uterus Measurements: 6.6 x 3.0 x 4.1 cm = volume: 42.2 mL. No fibroids or other mass visualized. Endometrium Thickness: 1 cm.  IUD seen within the endometrial canal. Right ovary Measurements: 3.1 x 2.4 x 2.0 cm = volume: 10.2 mL. Normal appearance/no adnexal mass. Left ovary Measurements: 3.4 x 1.7 x 2.3 cm = volume: 6.8 mL. Normal appearance/no adnexal mass. Pulsed Doppler evaluation demonstrates normal low-resistance arterial and venous waveforms in both ovaries. Other: IMPRESSION: Endometrium measuring 1 cm. IUD within the endometrial canal. Normal appearing ovaries. Electronically Signed   By: Jonna Clark M.D.   On: 08/15/2019 23:16   US Pelvis Complete  Result Date: 08/15/2019 CLINICAL DATA:  Left lower quadrant pain and heavy bleeding EXAM: TRANSABDOMINAL ULTRASOUND OF PELVIS DOPPLER ULTRASOUND OF OVARIES TECHNIQUE:  Transabdominal ultrasound examination of the pelvis was performed including evaluation of the uterus, ovaries, adnexal regions, and pelvic cul-de-sac. Color and duplex Doppler ultrasound was utilized to evaluate blood flow to the ovaries. COMPARISON:  None. FINDINGS: Uterus Measurements: 6.6 x 3.0 x 4.1 cm = volume: 42.2 mL. No fibroids or other mass visualized. Endometrium Thickness: 1 cm.  IUD seen within the endometrial canal. Right ovary Measurements: 3.1 x 2.4 x 2.0 cm = volume: 10.2 mL. Normal appearance/no adnexal mass. Left ovary Measurements: 3.4 x 1.7 x 2.3 cm = volume: 6.8 mL. Normal appearance/no adnexal mass. Pulsed Doppler evaluation demonstrates normal low-resistance arterial and venous waveforms in both ovaries. Other: IMPRESSION: Endometrium measuring 1 cm. IUD within the endometrial canal. Normal appearing ovaries. Electronically Signed   By: Jonna Clark M.D.   On: 08/15/2019 23:16   [place] Art/Ven Flow Abd Pelv Doppler  Result Date: 08/15/2019 CLINICAL DATA:  Left lower quadrant pain and heavy bleeding EXAM: TRANSABDOMINAL ULTRASOUND OF PELVIS DOPPLER ULTRASOUND OF OVARIES TECHNIQUE: Transabdominal ultrasound examination of the pelvis was performed including evaluation of the uterus, ovaries, adnexal regions, and pelvic cul-de-sac. Color and duplex Doppler ultrasound was utilized to evaluate blood flow to the ovaries. COMPARISON:  None. FINDINGS: Uterus Measurements: 6.6 x 3.0 x 4.1 cm = volume: 42.2 mL. No fibroids or other mass visualized. Endometrium Thickness: 1 cm.  IUD seen within the endometrial canal. Right ovary Measurements: 3.1 x 2.4 x 2.0 cm = volume: 10.2 mL. Normal appearance/no adnexal mass. Left ovary Measurements: 3.4 x 1.7 x 2.3 cm = volume: 6.8 mL. Normal appearance/no adnexal mass. Pulsed Doppler evaluation demonstrates normal low-resistance arterial and venous waveforms in both ovaries. Other: IMPRESSION: Endometrium measuring 1 cm. IUD within the endometrial canal. Normal  appearing ovaries. Electronically Signed   By: Jonna Clark M.D.   On: 08/15/2019 23:16    Procedures Procedures (including critical care time)  Medications Ordered in ED Medications  sodium chloride flush (NS) 0.9 % injection 3 mL (has no administration in time range)  megestrol (MEGACE) tablet 20 mg (has no administration in time range)  HYDROcodone-acetaminophen (NORCO/VICODIN) 5-325 MG per tablet 1 tablet (1 tablet Oral Given 08/15/19 2243)  megestrol (MEGACE) tablet 20 mg (20 mg Oral Given  08/16/19 0141)    ED Course  I have reviewed the triage vital signs and the nursing notes.  Pertinent labs & imaging results that were available during my care of the patient were reviewed by me and considered in my medical decision making (see chart for details).  Clinical Course as of Aug 15 144  Tue Aug 16, 2019  5443 19 year old female with complaint of vaginal bleeding since April 23 when IUD was placed.  On exam, small amount of active bleeding, no clots, no hemorrhage or significant bleeding at this time.  CBC reviewed, normal H&H.  Remaining labs without significant findings, suspect urinalysis is contaminated.  Patient is not pregnant.  Wet prep is positive for clue cells, no complaints at this time.   [LM]  0145 Ultrasound is negative for torsion, does show endometrium at 1 cm, IUD in place. Patient was given dose of Megace, discharged with prescription for same and advised to contact her gynecologist for follow-up.   [LM]    Clinical Course User Index [LM] Alden Hipp   MDM Rules/Calculators/A&P                      Final Clinical Impression(s) / ED Diagnoses Final diagnoses:  Vaginal bleeding    Rx / DC Orders ED Discharge Orders         Ordered    megestrol (MEGACE) 20 MG tablet  Daily     08/15/19 2347           Jeannie Fend, PA-C 08/16/19 0146    Melene Plan, DO 08/16/19 2259

## 2019-08-15 NOTE — Discharge Instructions (Addendum)
Take Megace daily as prescribed and follow up with your GYN office.  Megace should help slow down or stop the bleeding.

## 2019-08-15 NOTE — ED Triage Notes (Signed)
Having heavy vaginal bleeding since 4/23; IUD placed.

## 2019-08-16 LAB — GC/CHLAMYDIA PROBE AMP (~~LOC~~) NOT AT ARMC
Chlamydia: NEGATIVE
Comment: NEGATIVE
Comment: NORMAL
Neisseria Gonorrhea: NEGATIVE

## 2019-08-16 LAB — WET PREP, GENITAL
Sperm: NONE SEEN
Trich, Wet Prep: NONE SEEN
Yeast Wet Prep HPF POC: NONE SEEN

## 2019-08-16 MED ORDER — MEGESTROL ACETATE 20 MG PO TABS: 20.0000 mg | ORAL_TABLET | Freq: Every day | ORAL | Status: DC

## 2019-08-16 MED ORDER — MEGESTROL ACETATE 20 MG PO TABS
20.0000 mg | ORAL_TABLET | ORAL | Status: AC
  Administered 2019-08-16: 20 mg via ORAL
  Filled 2019-08-16: qty 1

## 2019-08-28 ENCOUNTER — Emergency Department (HOSPITAL_COMMUNITY)
Admission: EM | Admit: 2019-08-28 | Discharge: 2019-08-29 | Disposition: A | Discharge status: Home / Self Care | Payer: Medicaid Other | Attending: Emergency Medicine | Admitting: Emergency Medicine

## 2019-08-28 ENCOUNTER — Other Ambulatory Visit: Payer: Self-pay

## 2019-08-28 ENCOUNTER — Encounter (HOSPITAL_COMMUNITY): Payer: Self-pay | Admitting: Emergency Medicine

## 2019-08-28 ENCOUNTER — Emergency Department (HOSPITAL_COMMUNITY): Payer: Medicaid Other

## 2019-08-28 DIAGNOSIS — R4182 Altered mental status, unspecified: Secondary | ICD-10-CM | POA: Diagnosis present

## 2019-08-28 DIAGNOSIS — Z79899 Other long term (current) drug therapy: Secondary | ICD-10-CM | POA: Insufficient documentation

## 2019-08-28 DIAGNOSIS — F3181 Bipolar II disorder: Secondary | ICD-10-CM | POA: Diagnosis not present

## 2019-08-28 LAB — CBC WITH DIFFERENTIAL/PLATELET
Abs Immature Granulocytes: 0.02 10*3/uL (ref 0.00–0.07)
Basophils Absolute: 0.1 10*3/uL (ref 0.0–0.1)
Basophils Relative: 1 %
Eosinophils Absolute: 0.1 10*3/uL (ref 0.0–0.5)
Eosinophils Relative: 1 %
HCT: 42.3 % (ref 36.0–46.0)
Hemoglobin: 12.9 g/dL (ref 12.0–15.0)
Immature Granulocytes: 0 %
Lymphocytes Relative: 42 %
Lymphs Abs: 3.1 10*3/uL (ref 0.7–4.0)
MCH: 27.4 pg (ref 26.0–34.0)
MCHC: 30.5 g/dL (ref 30.0–36.0)
MCV: 89.8 fL (ref 80.0–100.0)
Monocytes Absolute: 0.5 10*3/uL (ref 0.1–1.0)
Monocytes Relative: 7 %
Neutro Abs: 3.7 10*3/uL (ref 1.7–7.7)
Neutrophils Relative %: 49 %
Platelets: 332 10*3/uL (ref 150–400)
RBC: 4.71 MIL/uL (ref 3.87–5.11)
RDW: 14.6 % (ref 11.5–15.5)
WBC: 7.5 10*3/uL (ref 4.0–10.5)
nRBC: 0 % (ref 0.0–0.2)

## 2019-08-28 LAB — RAPID URINE DRUG SCREEN, HOSP PERFORMED
Amphetamines: NOT DETECTED
Barbiturates: NOT DETECTED
Benzodiazepines: NOT DETECTED
Cocaine: NOT DETECTED
Opiates: NOT DETECTED
Tetrahydrocannabinol: NOT DETECTED

## 2019-08-28 LAB — COMPREHENSIVE METABOLIC PANEL
ALT: 17 U/L (ref 0–44)
AST: 15 U/L (ref 15–41)
Albumin: 4.6 g/dL (ref 3.5–5.0)
Alkaline Phosphatase: 56 U/L (ref 38–126)
Anion gap: 6 (ref 5–15)
BUN: 9 mg/dL (ref 6–20)
CO2: 25 mmol/L (ref 22–32)
Calcium: 9.1 mg/dL (ref 8.9–10.3)
Chloride: 110 mmol/L (ref 98–111)
Creatinine, Ser: 0.85 mg/dL (ref 0.44–1.00)
GFR calc Af Amer: >60 mL/min (ref >60)
GFR calc non Af Amer: >60 mL/min (ref >60)
Glucose, Bld: 99 mg/dL (ref 70–99)
Potassium: 3.9 mmol/L (ref 3.5–5.1)
Sodium: 141 mmol/L (ref 135–145)
Total Bilirubin: 0.1 mg/dL — ABNORMAL LOW (ref 0.3–1.2)
Total Protein: 7.8 g/dL (ref 6.5–8.1)

## 2019-08-28 LAB — I-STAT BETA HCG BLOOD, ED (MC, WL, AP ONLY): I-stat hCG, quantitative: <5 m[IU]/mL (ref <5)

## 2019-08-28 LAB — ETHANOL: Alcohol, Ethyl (B): <10 mg/dL (ref <10)

## 2019-08-28 NOTE — ED Provider Notes (Signed)
[facility]-EMERGENCY DEPT Provider Note   CSN: [id number] Arrival date & time: 08/28/19  2143     History Chief Complaint  Patient presents with  . Medical Clearance    Brittany [name] is a 19 y.o. female.  19 y.o female with a PMH of Bipolar 2 presents to the ED via EMS after being found in the tub by mother.  Patient is unable to provide further history, this report that she is unsure why she is here, last she remembers is that "I was in the tub I do not know ".  Will obtain more information from mother.  Denies SI, HI, visual or auditory hallucinations.  Level 5 caveat.   The history is provided by the patient and medical records.       Past Medical History:  Diagnosis Date  . ADHD (attention deficit hyperactivity disorder)   . Attention deficit hyperactivity disorder (ADHD) 08/01/2015  . Bipolar 1 disorder (HCC)   . Bipolar disorder (HCC)   . Depression   . Depressive disorder 08/01/2015  . Headache   . Seasonal allergies     Patient Active Problem List   Diagnosis Date Noted  . Defiant behavior 07/06/2018  . Attention-deficit hyperactivity disorder, predominantly hyperactive type   . Homicidal ideation   . Attention deficit hyperactivity disorder (ADHD) 08/01/2015  . Depressive disorder 08/01/2015  . DMDD (disruptive mood dysregulation disorder) (HCC) 07/26/2015  . Tension headache 05/23/2014  . Depression 05/23/2014  . Anxiety state 05/23/2014    Past Surgical History:  Procedure Laterality Date  . NO PAST SURGERIES       OB History   No obstetric history on file.     Family History  Adopted: Yes  Problem Relation Age of Onset  . HIV Mother        Died at 86    Social History   Tobacco Use  . Smoking status: Never Smoker  . Smokeless tobacco: Never Used  Substance Use Topics  . Alcohol use: No    Alcohol/week: 0.0 standard drinks  . Drug use: No    Home Medications Prior to Admission medications   Medication Sig  Start Date End Date Taking? Authorizing Provider  ARIPiprazole (ABILIFY) 20 MG tablet Take 20 mg by mouth at bedtime.  04/29/18   [provider]  cyclobenzaprine (FLEXERIL) 5 MG tablet Take 1 tablet (5 mg total) by mouth 3 (three) times daily as needed for muscle spasms. Patient not taking: Reported on 07/19/2019 06/23/18   Charlynne Pander, MD  lamoTRIgine (LAMICTAL) 25 MG tablet Take 50 mg by mouth daily.  06/07/19   [provider]  misoprostol (CYTOTEC) 200 MCG tablet Take 2 tablets (400 mcg total) by mouth once for 1 dose. Take both tablets by mouth the night before your procedure. Patient not taking: Reported on 08/16/2019 07/19/19 07/19/19  Leftwich-Kirby, Wilmer Floor, CNM  naproxen (NAPROSYN) 500 MG tablet Take 1 tablet (500 mg total) by mouth 2 (two) times daily with a meal. As needed for pain Patient not taking: Reported on 07/19/2019 06/07/18   Linwood Dibbles, MD  Norethindrone-Ethinyl Estradiol-Fe Biphas (LO LOESTRIN FE) 1 MG-10 MCG / 10 MCG tablet Take 1 tablet by mouth daily. Patient not taking: Reported on 08/05/2019 03/01/18   Hurshel Party, CNM  omeprazole (PRILOSEC) 10 MG capsule Take 10 mg by mouth daily.    [provider]  QUEtiapine (SEROQUEL) 100 MG tablet Take 100 mg by mouth at bedtime.  06/06/19  [provider]    Allergies    Patient has no known allergies.  Review of Systems   Review of Systems  Unable to perform ROS: Mental status change  Constitutional: Negative for fever.  HENT: Negative for sore throat.   Respiratory: Negative for shortness of breath.   Cardiovascular: Negative for chest pain.    Physical Exam Updated Vital Signs BP 109/78   Pulse 64   Temp 99.1 F (37.3 C) (Oral)   Resp 20   Ht 5\' 2"  (1.575 m)   Wt 52.2 kg   SpO2 99%   BMI 21.03 kg/m   Physical Exam Vitals and nursing note reviewed.  Constitutional:      Comments: Appears sleepy on exam.   HENT:     Head: Normocephalic and atraumatic.     Nose:  Nose normal.  Eyes:     Pupils: Pupils are equal, round, and reactive to light.  Cardiovascular:     Rate and Rhythm: Normal rate.  Pulmonary:     Effort: Pulmonary effort is normal.     Breath sounds: No wheezing.  Abdominal:     General: Abdomen is flat.     Tenderness: There is no abdominal tenderness.  Musculoskeletal:     Cervical back: Normal range of motion and neck supple.  Skin:    General: Skin is warm and dry.  Neurological:     Mental Status: She is alert and oriented to person, place, and time.     Motor: Motor function is intact.     Gait: Gait is intact.     Comments: No facial asymmetry, no dysarthria.  No focal deficit on my exam.  Patient was ambulatory to the bathroom with a steady gait.     ED Results / Procedures / Treatments   Labs (all labs ordered are listed, but only abnormal results are displayed) Labs Reviewed  COMPREHENSIVE METABOLIC PANEL - Abnormal; Notable for the following components:      Result Value   Total Bilirubin 0.1 (*)    All other components within normal limits  ETHANOL  RAPID URINE DRUG SCREEN, HOSP PERFORMED  CBC WITH DIFFERENTIAL/PLATELET  I-STAT BETA HCG BLOOD, ED ([facility], [facility], [facility] ONLY)    EKG None  Radiology CT Head Wo Contrast  Result Date: 08/28/2019 CLINICAL DATA:  Altered mental status, unclear call, EMS describes the patient as fully clothed in the shower with panic attack EXAM: CT HEAD WITHOUT CONTRAST TECHNIQUE: Contiguous axial images were obtained from the base of the skull through the vertex without intravenous contrast. COMPARISON:  CT head 05/10/2014 FINDINGS: Brain: No evidence of acute infarction, hemorrhage, hydrocephalus, extra-axial collection or mass lesion/mass effect. Vascular: No hyperdense vessel or unexpected calcification. Skull: No calvarial fracture or suspicious osseous lesion. No scalp swelling or hematoma. Sinuses/Orbits: Paranasal sinuses and mastoid air cells are predominantly clear. Included orbital  structures are unremarkable. Other: None IMPRESSION: Normal CT appearance of the brain. Electronically Signed   By: Lovena Le M.D.   On: 08/28/2019 23:58    Procedures Procedures (including critical care time)  Medications Ordered in ED Medications - No data to display  ED Course  I have reviewed the triage vital signs and the nursing notes.  Pertinent labs & imaging results that were available during my care of the patient were reviewed by me and considered in my medical decision making (see chart for details).    MDM Rules/Calculators/A&P   Patient with a past medical history of bipolar 2  presents to the ED via EMS after being found by mother on the bathtub with the water running.  According to mother she heard a big bump, states that patient was found on the bottom of the tub, still fully clothed with the water running.  She reports she had been getting an altercation with her yesterday.  Patient was unable to respond to mother, both brought in for altered mental status.  According to mother, most of her medication is safely locked away, has taken two trazodone yesterday, but did not ingest them. Mother reports that patient has had problems with her medication, she will likely need medication readjustment as she has not been herself lately.  She does not report any fevers or patient at home, no other complaints per mother.  11:13 PM Spoke to mother at the bedside who reports patient was found unconscious on the bathtub, she states she heard a big bump, likely somebody falling and patient did not allow her to walk into the bathroom.  She reports when she entered the bathroom, she noted patient to be laying in the bathtub with the water running, bra shorts were still on.  She reports patient had told her she was going to take a shower.  There has been some changes in behavior, they did have an argument yesterday where mother states patient was found with 2 pills of trazodone, her  medication is usually under supervision and locked.  She also reports patient was found today with 2 small blue pills?,  States she was taking the medication out of the pills and placing it on her dresser, this was then wiped off by mother and discarded.  Will obtain CT head as mother does not know if patient was out in the shower.  CBC is without any abnormalities.  CMP without any electrolyte derangement, LFTs are within normal limits.  Ethanol is normal.  Beta hCG is negative.  UDS is negative for any illicit substance.  CT Head obtained showed: Normal CT appearance of the brain.  Of note, patient is here voluntarily, has agreed to have consultation with TTS.  Mother reports that she attempted to IVC patient however has not filled out paperwork.  12:39 AM TTS consultation has been performed, they have recommended overnight observation at this time.  Patient will need to be reevaluated in the morning by TTS.  Portions of this note were generated with Scientist, clinical (histocompatibility and immunogenetics). Dictation errors may occur despite best attempts at proofreading.  Final Clinical Impression(s) / ED Diagnoses Final diagnoses:  Bipolar 2 disorder [facility])    Rx / DC Orders ED Discharge Orders    None       Claude Manges, PA-C 08/29/19 0039    Charlynne Pander, MD 08/29/19 [phone]

## 2019-08-28 NOTE — ED Triage Notes (Signed)
Pt arrived via EMS from home. EMS reports that pt was fully clothed in the shower having a panic attack when they were called out. Pt has hx of bipolar disorder. EMS reports that pt was able to walk down a flight of stairs with their help. Pt's mom is on the way per EMS.

## 2019-08-29 ENCOUNTER — Encounter (HOSPITAL_COMMUNITY): Payer: Self-pay | Admitting: Registered Nurse

## 2019-08-29 NOTE — [facility] Suicide Risk Assessment (Cosign Needed)
Suicide Risk Assessment  Discharge Assessment   [name] Discharge Suicide Risk Assessment   Principal Problem: <principal problem not specified> Discharge Diagnoses: Active Problems:   * No active hospital problems. *   Total Time spent with patient: 30 minutes  Musculoskeletal: Strength & Muscle Tone: within normal limits Gait & Station: normal Patient leans: N/A  Psychiatric Specialty Exam:   Blood pressure 126/69, pulse 79, temperature 99.1 F (37.3 C), temperature source Oral, resp. rate 18, height 5\' 2"  (1.575 m), weight 52.2 kg, SpO2 99 %.Body mass index is 21.03 kg/m.  General Appearance: Casual  Eye Contact::  Good  Speech:  Clear and Coherent409  Volume:  Normal  Mood:  "Better"  Affect:  Appropriate and Congruent  Thought Process:  Coherent, Goal Directed and Descriptions of Associations: Intact  Orientation:  Full (Time, Place, and Person)  Thought Content:  WDL  Suicidal Thoughts:  No  Homicidal Thoughts:  No  Memory:  Immediate;   Good Recent;   Good Remote;   Good  Judgement:  Intact  Insight:  Present  Psychomotor Activity:  Normal  Concentration:  Good  Recall:  Good  Fund of Knowledge:Fair  Language: Fair  Akathisia:  No  Handed:  Right  AIMS (if indicated):     Assets:  Communication Skills Desire for Improvement Housing Leisure Time Social Support  Sleep:     Cognition: WNL  ADL's:  Intact   Mental Status Per Nursing Assessment::   On Admission:    Brittany [name], 18 y.o., female patient seen via tele psych by this provider, Dr. 12-15-1974; and chart reviewed on 08/29/19.  On evaluation Brittany [name] reports she was not trying to kill herself yesterday.  Reports history or anxiety and panic attacks. Gave permission to speak to her mother for collateral information. During evaluation Brittany [name] is alert/oriented x 4; calm/cooperative; and mood is congruent with affect.  She does not appear to be responding to internal/external stimuli or  delusional thoughts.  Patient denies suicidal/self-harm/homicidal ideation, psychosis, and paranoia.  Patient answered question appropriately.  Collateral Information:  Spoke with Sylver Vantassell at [phone] (adoptive mother) who reports that patient was found in the bathtub with the water running and when she attempted to turn the water off the patient would push the stopper back down and that it had to be done twice.  States she called EMS and when arrived patient started shaking.  States that patient has history of erratic behavior.  "I think she likes this man and has been doing this to get him to come see her but he doesn't like her like that and I've talked to him and he has decided that it is best if he just doesn't see her anymore.  Mrs. Dooly informed of other impulsive decision patient has made that was not in best interest of patient.  States that she is unable to care or become patients guardian related to having a sick husband.  States that she has plans to call [employer] to see about getting a care coordinator and some assistance because she doesn't know what to do.  States that patient has been in and out of group homes and [facility] and feels that patient needs something like that now to make sure she doesn't put herself in harms way.  Currently patient is her own guardian.  Informed that would have social worker to call Renee Harder and Social worker will also respond back to her.  Patient does appear to  need a higher level of care and an guardian assigned related to patient unable to make rational or safe decisions for herself.  Mother also reports patient has a history of an intellectual disability, bipolar disorder and conduct disorder. Patient to follow up at [facility] for outpatient psychiatric services. States that she will be able to pick patient up at 1:00 PM today.      Demographic Factors:  Black female  Loss Factors: NA  Historical Factors: Impulsivity  Risk  Reduction Factors:   Religious beliefs about death, Living with another person, especially a relative and Positive social support  Continued Clinical Symptoms:  Previous Psychiatric Diagnoses and Treatments  Cognitive Features That Contribute To Risk:  None    Suicide Risk:  Minimal: No identifiable suicidal ideation.  Patients presenting with no risk factors but with morbid ruminations; may be classified as minimal risk based on the severity of the depressive symptoms    Plan Of Care/Follow-up recommendations:  Activity:  As tolerated Diet:  Heart healthy   Discharge Instructions     For your mental health needs, you are advised to continue treatment with [facility]:       [facility]      [address]      [place], [place] [address]      [phone]      Crisis number: [phone]  If for any reason you are unable to follow up with [facility], contact [facility]:       [facility]      [place], [place] [address]      [phone]      New patients are seen at their walk-in clinic.  Walk-in hours are Monday - Friday from 8:30 am - 12:00 pm, and from 1:00 pm - 2:30 pm.  Walk-in patients are seen on a first come, first served basis, so try to arrive as early as possible for the best chance of being seen the same day.     Disposition:  Psychiatrically cleared No evidence of imminent risk to self or others at present.   Patient does not meet criteria for psychiatric inpatient admission. Supportive therapy provided about ongoing stressors. Discussed crisis plan, support from social network, calling 911, coming to the Emergency Department, and calling Suicide Hotline.  Donielle Kaigler, NP 08/29/2019, 11:39 AM

## 2019-08-29 NOTE — Progress Notes (Signed)
Disposition: Nira Conn, NP recommends continued observation for safety and stabilization and to be reassessed in the AM by psych. EDP Claude Manges, PA-C and Weston Brass, RN have been advised.

## 2019-08-29 NOTE — BH Assessment (Signed)
Tele Assessment Note   Patient Name: Brittany [name] MRN: [id number] Referring Physician: Janeece Fitting, PA-C Location of Patient: [facility] Location of Provider: [place] is an 19 y.o. female who presents to the ED voluntarily. Pt reports she does not recall whey she is in the ED. Per reports, pt was found by her mother in a bathtub. Pt states she does not know the reason she was in the bathtub and does not recall any of the events that took place on 2022-07-29. Pt is oriented to self only. Pt states she takes medication for Bipolar d/o but denies any other drug or alcohol use. Pt has a hx of inpt admissions and ED visits c/o Bipolar affective d/o. Pt states she is followed by [facility] for OPT needs but states she has not been there "in a while." Pt denies HI and denies AVH. Per chart review, pt overdosed on medication in Feb 2021 and was admitted to [facility] facility. Pt reports her mother passed away in Jul 29, 2006. Pt states she is graduating from [employer] on June 5th.  Pt is alert during the assessment. Pt judgement is partial. Pt affect is flat. Pt mood is despair. Pt thought process is relevant and coherent. Pt does not appear to be responding to internal stimuli during the assessment.   TTS attempted to contact the pt's adoptive mother in order to obtain collateral information but did not receive an answer. A HIPAA compliant v/m was left requesting a call back.  Disposition: Lindon Romp, NP recommends continued observation for safety and stabilization and to be reassessed in the AM by psych. EDP Janeece Fitting, PA-C and Fanny Bien, RN have been advised.   Diagnosis: Bipolar affective d/o  Past Medical History:  Past Medical History:  Diagnosis Date  . ADHD (attention deficit hyperactivity disorder)   . Attention deficit hyperactivity disorder (ADHD) 08/01/2015  . Bipolar 1 disorder ([place])   . Bipolar disorder ([place])   . Depression   .  Depressive disorder 08/01/2015  . Headache   . Seasonal allergies     Past Surgical History:  Procedure Laterality Date  . NO PAST SURGERIES      Family History:  Family History  Adopted: Yes  Problem Relation Age of Onset  . HIV Mother        Died at 52    Social History:  reports that she has never smoked. She has never used smokeless tobacco. She reports that she does not drink alcohol or use drugs.  Additional Social History:  Alcohol / Drug Use Pain Medications: See MAR Prescriptions: See MAR Over the Counter: See MAR History of alcohol / drug use?: No history of alcohol / drug abuse  CIWA: CIWA-Ar BP: 109/78 Pulse Rate: 64 COWS:    Allergies: No Known Allergies  Home Medications: (Not in a hospital admission)   OB/GYN Status:  No LMP recorded. Patient has had an injection.  General Assessment Data [facility] of Assessment: [facility] TTS Assessment: In system Is this a Tele or Face-to-Face Assessment?: Tele Assessment Is this an Initial Assessment or a Re-assessment for this encounter?: Initial Assessment Patient Accompanied by:: N/A Language Other than English: No Living Arrangements: Other (Comment) What gender do you identify as?: Female Marital status: Single Pregnancy Status: No Living Arrangements: Parent Can pt return to current living arrangement?: Yes Admission Status: Voluntary Is patient capable of signing voluntary admission?: Yes Referral Source: Self/Family/Friend Insurance type: MCD  Crisis Care Plan Living Arrangements: Parent Name of Psychiatrist: Vesta Mixer Name of Therapist: none  Education Status Is patient currently in school?: No(graduates June 5th 2021) Is the patient employed, unemployed or receiving disability?: Unemployed  Risk to self with the past 6 months Suicidal Ideation: No-Not Currently/Within Last 6 Months Has patient been a risk to self within the past 6 months prior to admission? : No Suicidal Intent: No Has  patient had any suicidal intent within the past 6 months prior to admission? : No Is patient at risk for suicide?: No Suicidal Plan?: No Has patient had any suicidal plan within the past 6 months prior to admission? : No Access to Means: No What has been your use of drugs/alcohol within the last 12 months?: denies Previous Attempts/Gestures: Yes How many times?: 1 Other Self Harm Risks: hx of suicide attempt, bipolar d/o Triggers for Past Attempts: Unpredictable Intentional Self Injurious Behavior: Cutting Comment - Self Injurious Behavior: hx of cutting in high school Family Suicide History: Yes(birth mother made attempts) Recent stressful life event(s): Other (Comment)(pt does not recall) Persecutory voices/beliefs?: No Depression: No Substance abuse history and/or treatment for substance abuse?: No Suicide prevention information given to non-admitted patients: Not applicable  Risk to Others within the past 6 months Homicidal Ideation: No Does patient have any lifetime risk of violence toward others beyond the six months prior to admission? : No Thoughts of Harm to Others: No Current Homicidal Intent: No Current Homicidal Plan: No Access to Homicidal Means: No History of harm to others?: No Assessment of Violence: None Noted Does patient have access to weapons?: No Criminal Charges Pending?: No Does patient have a court date: No Is patient on probation?: No  Psychosis Hallucinations: None noted Delusions: None noted  Mental Status Report Appearance/Hygiene: In hospital gown Eye Contact: Good Motor Activity: Freedom of movement Speech: Logical/coherent Level of Consciousness: Quiet/awake Mood: Despair Affect: Flat Anxiety Level: None Thought Processes: Relevant, Coherent Judgement: Partial Orientation: Person Obsessive Compulsive Thoughts/Behaviors: None  Cognitive Functioning Concentration: Fair Memory: Remote Intact, Recent Impaired Is patient IDD:  No Insight: Poor Impulse Control: Fair Appetite: Good Have you had any weight changes? : No Change Sleep: No Change Total Hours of Sleep: 8 Vegetative Symptoms: None  ADLScreening [facility] Assessment Services) Patient's cognitive ability adequate to safely complete daily activities?: Yes Patient able to express need for assistance with ADLs?: Yes Independently performs ADLs?: Yes (appropriate for developmental age)  Prior Inpatient Therapy Prior Inpatient Therapy: Yes Prior Therapy Dates: February 2021. Prior Therapy Facilty/Provider(s): [facility]. Reason for Treatment: Trazodone overdose.  Prior Outpatient Therapy Prior Outpatient Therapy: Yes Prior Therapy Dates: Current. Prior Therapy Facilty/Provider(s): [facility] Reason for Treatment: Medication mangement. Does patient have an ACCT team?: No Does patient have Intensive In-House Services?  : No Does patient have [facility] services? : Yes Does patient have P4CC services?: No  ADL Screening (condition at time of admission) Patient's cognitive ability adequate to safely complete daily activities?: Yes Is the patient deaf or have difficulty hearing?: No Does the patient have difficulty seeing, even when wearing glasses/contacts?: No Does the patient have difficulty concentrating, remembering, or making decisions?: No Patient able to express need for assistance with ADLs?: Yes Does the patient have difficulty dressing or bathing?: No Independently performs ADLs?: Yes (appropriate for developmental age) Does the patient have difficulty walking or climbing stairs?: No Weakness of Legs: None Weakness of Arms/Hands: None  Home Assistive Devices/Equipment Home Assistive Devices/Equipment: None    Abuse/Neglect Assessment (Assessment to be complete  while patient is alone) Abuse/Neglect Assessment Can Be Completed: Yes Physical Abuse: Denies Verbal Abuse: Denies Sexual Abuse: Denies Exploitation of patient/patient's  resources: Denies Self-Neglect: Denies     Merchant navy officer (For Healthcare) Does Patient Have a Medical Advance Directive?: No Would patient like information on creating a medical advance directive?: No - Patient declined          Disposition: Nira Conn, NP recommends continued observation for safety and stabilization and to be reassessed in the AM by psych. EDP Claude Manges, PA-C and Weston Brass, RN have been advised. Disposition Initial Assessment Completed for this Encounter: Yes Disposition of Patient: (overnight OBS ) Patient refused recommended treatment: No  This service was provided via telemedicine using a 2-way, interactive audio and video technology.  Names of all persons participating in this telemedicine service and their role in this encounter. Name: Brittany [name] Role: Patient  Name: Princess Bruins, [place] Role: TTS  Name: Nira Conn, NP Role: [facility] provider       Karolee Ohs 08/29/2019 12:58 AM

## 2019-08-29 NOTE — Discharge Instructions (Signed)
For your mental health needs, you are advised to continue treatment with [facility]:       [facility]      [address]      [place], [place] [address]      [phone]      Crisis number: [phone]  If for any reason you are unable to follow up with [facility], contact [facility]:       [facility]      [address]      [place], [place] [address]      [phone]      New patients are seen at their walk-in clinic.  Walk-in hours are Monday - Friday from 8:30 am - 12:00 pm, and from 1:00 pm - 2:30 pm.  Walk-in patients are seen on a first come, first served basis, so try to arrive as early as possible for the best chance of being seen the same day. 

## 2019-08-29 NOTE — BH Assessment (Signed)
[facility] Assessment Progress Note  Per Shuvon Rankin, FNP, this pt does not require psychiatric hospitalization at this time.  Pt is to be discharged from [facility].  She requests that I speak to pt's adoptive mother, Alazay Leicht [phone]), to discuss treatment plan.  She also asks that I call [employer], pt's LME, to submit a request for care coordination services to arrange for a higher level of care.  At 11:50 I called [employer], informing her that pt is advised to continue treatment with [facility] for now, but is also being provided with referral information for [facility] [place] as a back up plan.  I informed that I would be contacting [employer] to request care coordination for the pt.  I also informed her how to go about appealing to the court to have pt adjudicated incompetent and to have a guardian assigned.  She appreciates my recommendations and agrees to present at [facility] at 13:00 to pick the pt up.  At 12:03 I called [employer] [phone]) and spoke to [facility].  She took clinical information along with request, which she agrees to submit.  As noted, discharge instructions advise pt to continue treatment with [facility], with [facility] as a back up plan.  Pt's nurse has been notified, and has been provided with information about guardianship to give to [facility] [place] when she arrives.  Doylene Canning, MA Triage Specialist [phone]

## 2019-09-02 ENCOUNTER — Ambulatory Visit: Payer: Medicaid Other | Admitting: Medical

## 2019-09-13 ENCOUNTER — Ambulatory Visit: Payer: Medicaid Other | Admitting: Student

## 2019-11-22 ENCOUNTER — Other Ambulatory Visit: Payer: Self-pay

## 2019-11-22 ENCOUNTER — Ambulatory Visit (INDEPENDENT_AMBULATORY_CARE_PROVIDER_SITE_OTHER): Payer: Medicaid Other | Admitting: *Deleted

## 2019-11-22 ENCOUNTER — Other Ambulatory Visit (HOSPITAL_COMMUNITY)
Admission: RE | Admit: 2019-11-22 | Discharge: 2019-11-22 | Disposition: A | Discharge status: Home / Self Care | Payer: Medicaid Other | Source: Ambulatory Visit | Attending: Obstetrics and Gynecology | Admitting: Obstetrics and Gynecology

## 2019-11-22 DIAGNOSIS — N760 Acute vaginitis: Secondary | ICD-10-CM | POA: Diagnosis not present

## 2019-11-22 DIAGNOSIS — Z113 Encounter for screening for infections with a predominantly sexual mode of transmission: Secondary | ICD-10-CM

## 2019-11-22 NOTE — Progress Notes (Signed)
SUBJECTIVE:  19 y.o. female complains of vaginal discharge. Request full self swab.  Denies abnormal vaginal bleeding or significant pelvic pain or fever. No UTI symptoms. Denies history of known exposure to STD.  No LMP recorded. Patient has had an injection.  OBJECTIVE:  She appears well, afebrile.   ASSESSMENT:  Vaginal Discharge     PLAN:  GC, chlamydia, trichomonas, BVAG, CVAG probe sent to lab. Treatment: To be determined once lab results are received ROV prn if symptoms persist or worsen.

## 2019-11-22 NOTE — Progress Notes (Signed)
Patient was assessed and managed by nursing staff during this encounter. I have reviewed the chart and agree with the documentation and plan. I have also made any necessary editorial changes. ° °Nainika Newlun A Zaylon Bossier, MD °11/22/2019 4:13 PM   °

## 2019-11-23 ENCOUNTER — Telehealth: Payer: Self-pay

## 2019-11-23 LAB — CERVICOVAGINAL ANCILLARY ONLY
Bacterial Vaginitis (gardnerella): POSITIVE — AB
Candida Glabrata: NEGATIVE
Candida Vaginitis: NEGATIVE
Chlamydia: NEGATIVE
Comment: NEGATIVE
Comment: NEGATIVE
Comment: NEGATIVE
Comment: NEGATIVE
Comment: NEGATIVE
Comment: NORMAL
Neisseria Gonorrhea: NEGATIVE
Trichomonas: NEGATIVE

## 2019-11-23 MED ORDER — METRONIDAZOLE 500 MG PO TABS
500.0000 mg | ORAL_TABLET | Freq: Two times a day (BID) | ORAL | 0 refills | Status: DC
Start: 2019-11-23 — End: 2020-01-05

## 2019-11-23 NOTE — Telephone Encounter (Signed)
S/w pt and advised of results and rx sent. 

## 2020-01-05 ENCOUNTER — Other Ambulatory Visit: Payer: Self-pay

## 2020-01-05 ENCOUNTER — Ambulatory Visit
Admission: EM | Admit: 2020-01-05 | Discharge: 2020-01-05 | Disposition: A | Discharge status: Home / Self Care | Payer: Medicaid Other | Attending: Emergency Medicine | Admitting: Emergency Medicine

## 2020-01-05 DIAGNOSIS — S00522A Blister (nonthermal) of oral cavity, initial encounter: Secondary | ICD-10-CM

## 2020-01-05 DIAGNOSIS — R6884 Jaw pain: Secondary | ICD-10-CM

## 2020-01-05 DIAGNOSIS — K0510 Chronic gingivitis, plaque induced: Secondary | ICD-10-CM | POA: Diagnosis not present

## 2020-01-05 DIAGNOSIS — Z1152 Encounter for screening for COVID-19: Secondary | ICD-10-CM

## 2020-01-05 MED ORDER — AMOXICILLIN-POT CLAVULANATE 875-125 MG PO TABS: 1.0000 | ORAL_TABLET | Freq: Two times a day (BID) | ORAL | 0 refills | Status: AC

## 2020-01-05 MED ORDER — NAPROXEN 500 MG PO TABS
500.0000 mg | ORAL_TABLET | Freq: Two times a day (BID) | ORAL | 0 refills | Status: DC | PRN
Start: 2020-01-05 — End: 2020-03-03

## 2020-01-05 NOTE — ED Provider Notes (Signed)
[name]    CSN: [id number] Arrival date & time: 01/05/20  1046      History   Chief Complaint Chief Complaint  Patient presents with  . Jaw Pain    HPI Brittany [name] is a 19 y.o. female.   19 year old female presents with left lower jaw pain that started about 4 days ago. Pain has gotten worse with more swelling. Yesterday developed a sore throat and loss of taste. No distinct fever but has felt warm and also has some nausea. Denies any nasal congestion, cough, or vomiting. She has never had this pain before and no history of cavities/dental abscess. No known exposure to COVID 19. Has been fully vaccinated. Has not taken any medication for pain. Other chronic health issues include bipolar disorder, ADHD, headaches and seasonal allergies. Currently on Lamictal, Abilify, Seroquel and Prilosec daily.   The history is provided by the patient.    Past Medical History:  Diagnosis Date  . ADHD (attention deficit hyperactivity disorder)   . Attention deficit hyperactivity disorder (ADHD) 08/01/2015  . Bipolar 1 disorder (HCC)   . Bipolar disorder (HCC)   . Depression   . Depressive disorder 08/01/2015  . Headache   . Seasonal allergies     Patient Active Problem List   Diagnosis Date Noted  . Defiant behavior 07/06/2018  . Attention-deficit hyperactivity disorder, predominantly hyperactive type   . Homicidal ideation   . Attention deficit hyperactivity disorder (ADHD) 08/01/2015  . Depressive disorder 08/01/2015  . DMDD (disruptive mood dysregulation disorder) (HCC) 07/26/2015  . Tension headache 05/23/2014  . Depression 05/23/2014  . Anxiety state 05/23/2014    Past Surgical History:  Procedure Laterality Date  . NO PAST SURGERIES      OB History   No obstetric history on file.      Home Medications    Prior to Admission medications   Medication Sig Start Date End Date Taking? Authorizing Provider  amoxicillin-clavulanate (AUGMENTIN) 875-125  MG tablet Take 1 tablet by mouth every 12 (twelve) hours for 7 days. 01/05/20 01/12/20  Sudie Grumbling, NP  ARIPiprazole (ABILIFY) 20 MG tablet Take 20 mg by mouth at bedtime.  04/29/18   [provider]  lamoTRIgine (LAMICTAL) 25 MG tablet Take 50 mg by mouth daily.  06/07/19   [provider]  naproxen (NAPROSYN) 500 MG tablet Take 1 tablet (500 mg total) by mouth 2 (two) times daily as needed for moderate pain. 01/05/20   Sudie Grumbling, NP  omeprazole (PRILOSEC) 10 MG capsule Take 10 mg by mouth daily.    [provider]  QUEtiapine (SEROQUEL) 100 MG tablet Take 100 mg by mouth at bedtime.  06/06/19   [provider]    Family History Family History  Adopted: Yes  Problem Relation Age of Onset  . HIV Mother        Died at 17    Social History Social History   Tobacco Use  . Smoking status: Never Smoker  . Smokeless tobacco: Never Used  Substance Use Topics  . Alcohol use: No    Alcohol/week: 0.0 standard drinks  . Drug use: No     Allergies   Patient has no known allergies.   Review of Systems Review of Systems  Constitutional: Positive for appetite change and fatigue. Negative for activity change, chills, diaphoresis and fever (but has felt warm).  HENT: Positive for facial swelling, mouth sores and sore throat. Negative for congestion, ear discharge, ear  pain, nosebleeds, postnasal drip, rhinorrhea, sinus pressure, sinus pain, sneezing and trouble swallowing.   Eyes: Negative for pain, discharge, redness and itching.  Respiratory: Negative for cough, chest tightness, shortness of breath and wheezing.   Gastrointestinal: Positive for nausea. Negative for vomiting.  Musculoskeletal: Positive for arthralgias. Negative for neck pain and neck stiffness.  Skin: Negative for color change and rash.  Allergic/Immunologic: Positive for environmental allergies. Negative for food allergies and immunocompromised state.  Neurological: Positive for  headaches. Negative for dizziness, seizures, syncope, weakness, light-headedness and numbness.  Hematological: Negative for adenopathy. Does not bruise/bleed easily.     Physical Exam Triage Vital Signs ED Triage Vitals [01/05/20 1100]  Enc Vitals Group     BP (!) 117/52     Pulse Rate 77     Resp 18     Temp 98.2 F (36.8 C)     Temp Source Oral     SpO2 98 %     Weight      Height      Head Circumference      Peak Flow      Pain Score 8     Pain Loc      Pain Edu?      Excl. in GC?    No data found.  Updated Vital Signs BP (!) 117/52 (BP Location: Left Arm)   Pulse 77   Temp 98.2 F (36.8 C) (Oral)   Resp 18   SpO2 98%   Visual Acuity Right Eye Distance:   Left Eye Distance:   Bilateral Distance:    Right Eye Near:   Left Eye Near:    Bilateral Near:     Physical Exam Vitals and nursing note reviewed.  Constitutional:      General: She is awake. She is not in acute distress.    Appearance: She is well-developed and well-groomed.     Comments: She is sitting on the exam table in no acute distress but appears in pain.   HENT:     Head: Normocephalic.     Jaw: Tenderness, swelling and pain on movement present.     Salivary Glands: Right salivary gland is not diffusely enlarged. Left salivary gland is not diffusely enlarged.      Comments: Slight swelling present and very tender along left upper jaw near TMJ and also very tender along left lower posterior jaw line. No distinct redness seen.     Right Ear: Hearing, tympanic membrane, ear canal and external ear normal.     Left Ear: Hearing, tympanic membrane, ear canal and external ear normal.     Nose: Nose normal.     Right Sinus: No maxillary sinus tenderness or frontal sinus tenderness.     Left Sinus: No maxillary sinus tenderness or frontal sinus tenderness.     Mouth/Throat:     Lips: Pink.     Mouth: Mucous membranes are moist. No angioedema.     Dentition: Gingival swelling and gum lesions  present.     Pharynx: Uvula midline. Posterior oropharyngeal erythema present. No pharyngeal swelling, oropharyngeal exudate or uvula swelling.      Comments: Ulcer and redness and swelling present on left upper gum behind last molar into left palate. Very tender and swollen. No distinct discharge present. No distinct dental caries present. No pain along lower teeth.  Eyes:     Extraocular Movements: Extraocular movements intact.     Conjunctiva/sclera: Conjunctivae normal.  Cardiovascular:     Rate and Rhythm:  Normal rate and regular rhythm.     Heart sounds: Normal heart sounds. No murmur heard.   Pulmonary:     Effort: Pulmonary effort is normal. No respiratory distress.     Breath sounds: Normal breath sounds and air entry. No decreased air movement. No decreased breath sounds, wheezing, rhonchi or rales.  Musculoskeletal:     Cervical back: Normal range of motion and neck supple. No rigidity.  Lymphadenopathy:     Cervical: Cervical adenopathy present.     Right cervical: No superficial cervical adenopathy.    Left cervical: Superficial cervical adenopathy present.  Skin:    General: Skin is warm and dry.     Capillary Refill: Capillary refill takes less than 2 seconds.     Findings: No erythema or rash.  Neurological:     General: No focal deficit present.     Mental Status: She is alert and oriented to person, place, and time.  Psychiatric:        Mood and Affect: Mood normal.        Behavior: Behavior normal. Behavior is cooperative.        Thought Content: Thought content normal.        Judgment: Judgment normal.      UC Treatments / Results  Labs (all labs ordered are listed, but only abnormal results are displayed) Labs Reviewed  NOVEL CORONAVIRUS, NAA    EKG   Radiology No results found.  Procedures Procedures (including critical care time)  Medications Ordered in UC Medications - No data to display  Initial Impression / Assessment and Plan / UC  Course  I have reviewed the triage vital signs and the nursing notes.  Pertinent labs & imaging results that were available during my care of the patient were reviewed by me and considered in my medical decision making (see chart for details).    Reviewed with patient that she appears to have a gum infection behind her last upper molar- unable to determine if she has a dental abscess since patient can not open her mouth fully due to pain. Will treat for probable bacterial infection- do not feel this is strep and defer testing for strep at this time. Will start Augmentin 875mg  twice a day as directed. May take Naproxen 500mg  twice a day as needed for pain and swelling. May apply cool compresses to area for comfort and alternate with heat as needed. Rest. Stay at home. May need to follow-up with dentist if symptoms persist or reoccur. Note written for work. Follow-up pending COVID 19 test results and in 3 to 4 days if not improving.   Final Clinical Impressions(s) / UC Diagnoses   Final diagnoses:  Encounter for screening for COVID-19  Pain in lower jaw  Blister of gum with infection, initial encounter     Discharge Instructions     You have an infection of the gum behind your left upper molar. Recommend start Augmentin 875mg  twice a day as directed. May take Naproxen 500mg  twice a day as needed for pain and swelling. May apply cool compresses to area for comfort and alternate with heat as needed. Rest. Stay at home. Follow-up pending COVID 19 test results and in 3 to 4 days if not improving.     ED Prescriptions    Medication Sig Dispense Auth. Provider   amoxicillin-clavulanate (AUGMENTIN) 875-125 MG tablet Take 1 tablet by mouth every 12 (twelve) hours for 7 days. 14 tablet Santia Labate, , NP   naproxen (NAPROSYN)  500 MG tablet Take 1 tablet (500 mg total) by mouth 2 (two) times daily as needed for moderate pain. 20 tablet Miyu Fenderson, Ali LoweAnn Berry, NP     PDMP not reviewed this encounter.    Sudie GrumblingAmyot, Lysander Calixte Berry, NP 01/05/20 1241

## 2020-01-05 NOTE — ED Triage Notes (Signed)
Pt c/o lt jaw pain x4 days. States sore throat and loss of taste since yesterday.

## 2020-01-05 NOTE — Discharge Instructions (Signed)
You have an infection of the gum behind your left upper molar. Recommend start Augmentin 875mg  twice a day as directed. May take Naproxen 500mg  twice a day as needed for pain and swelling. May apply cool compresses to area for comfort and alternate with heat as needed. Rest. Stay at home. Follow-up pending COVID 19 test results and in 3 to 4 days if not improving.

## 2020-01-06 LAB — NOVEL CORONAVIRUS, NAA: SARS-CoV-2, NAA: NOT DETECTED

## 2020-01-06 LAB — SARS-COV-2, NAA 2 DAY TAT

## 2020-01-19 IMAGING — CR DG ABDOMEN ACUTE W/ 1V CHEST
3 series · 3 of 3 positions shown · non-contrast
Comparison: CT 07/01/2015

CLINICAL DATA: Lower abdominal pain.

EXAM:
DG ABDOMEN ACUTE W/ 1V CHEST

[w chest pa]
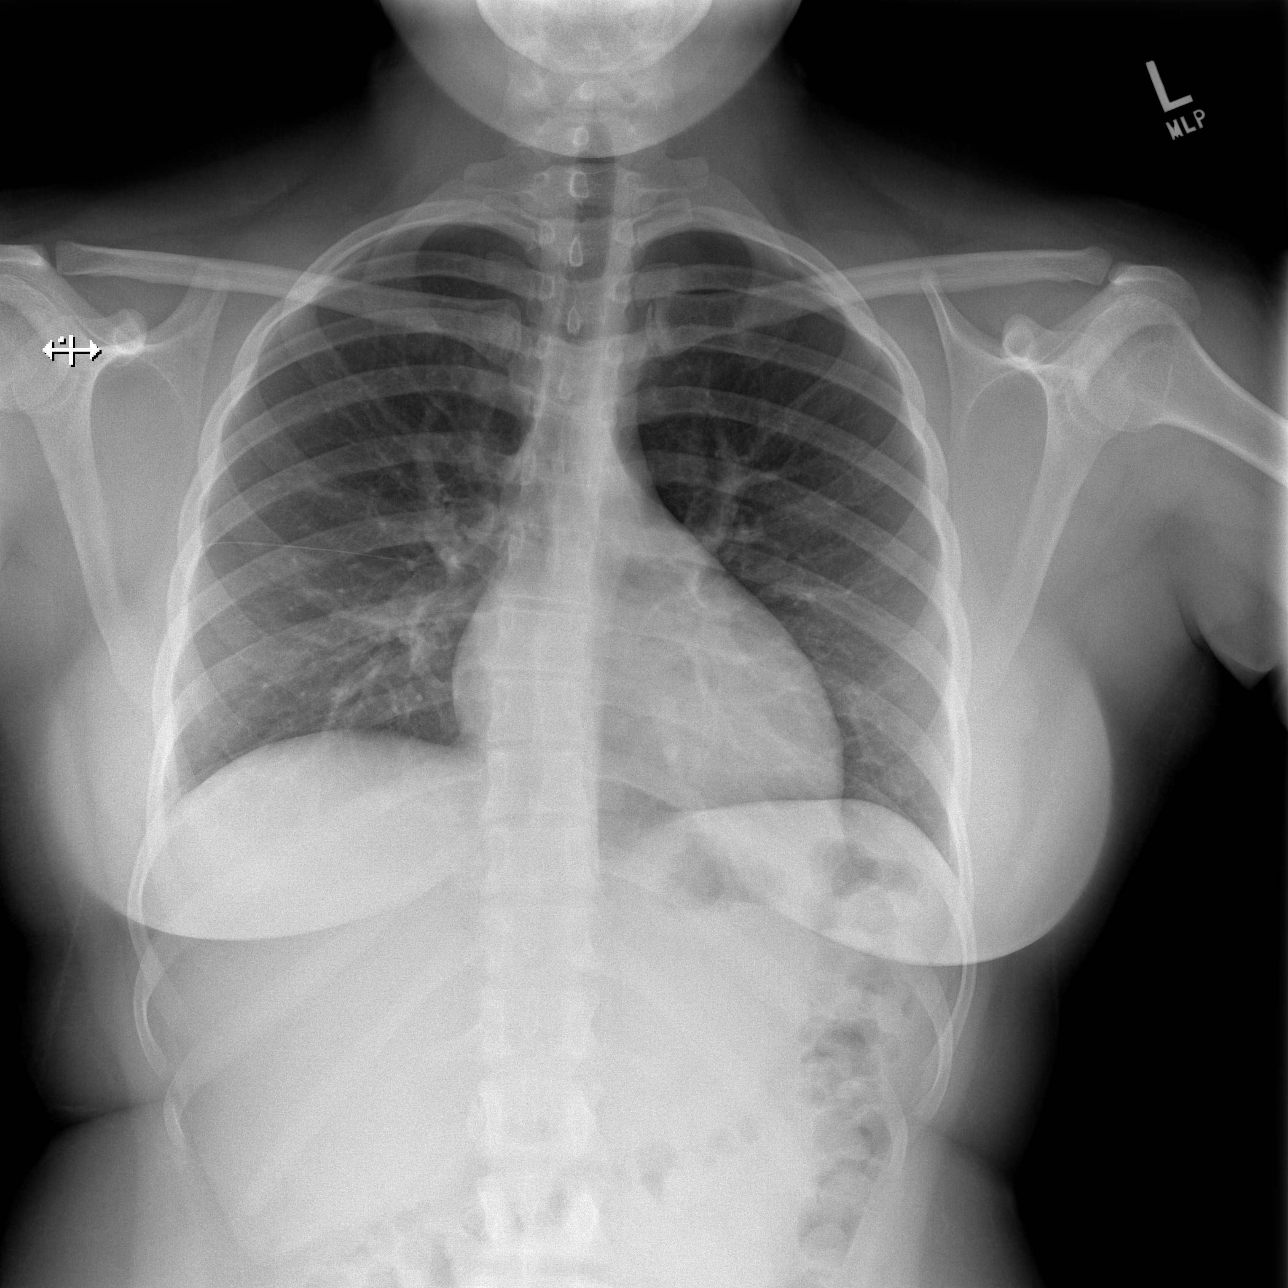

[w abdomen upright]
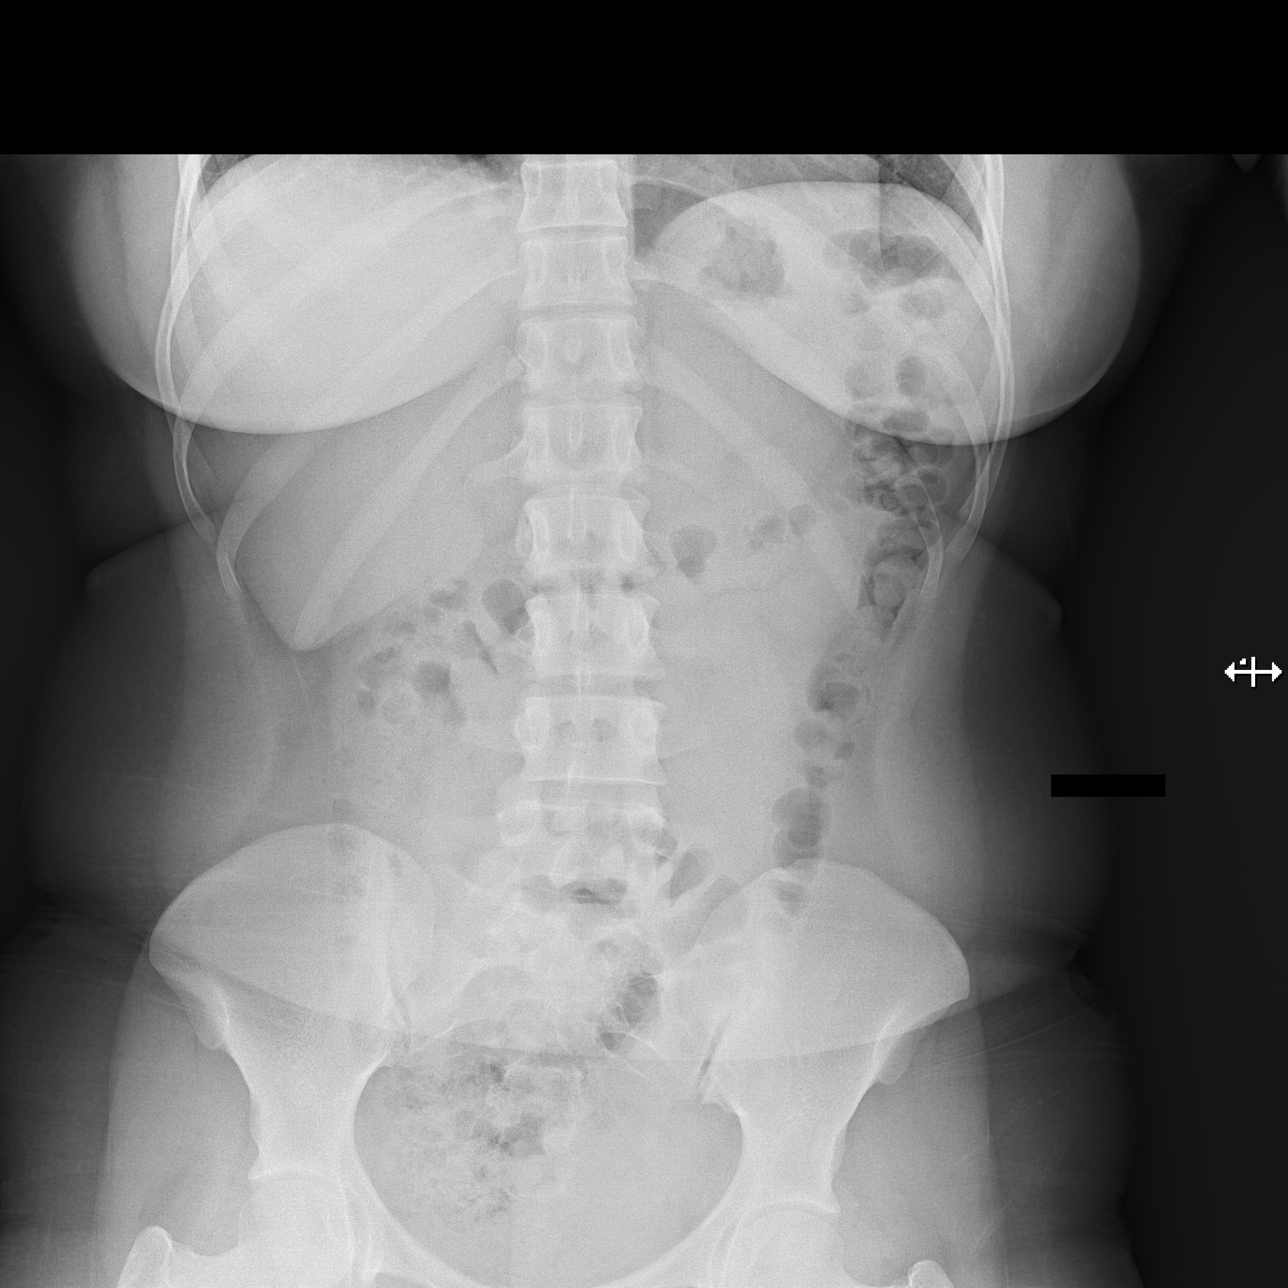

[t abdomen supine]
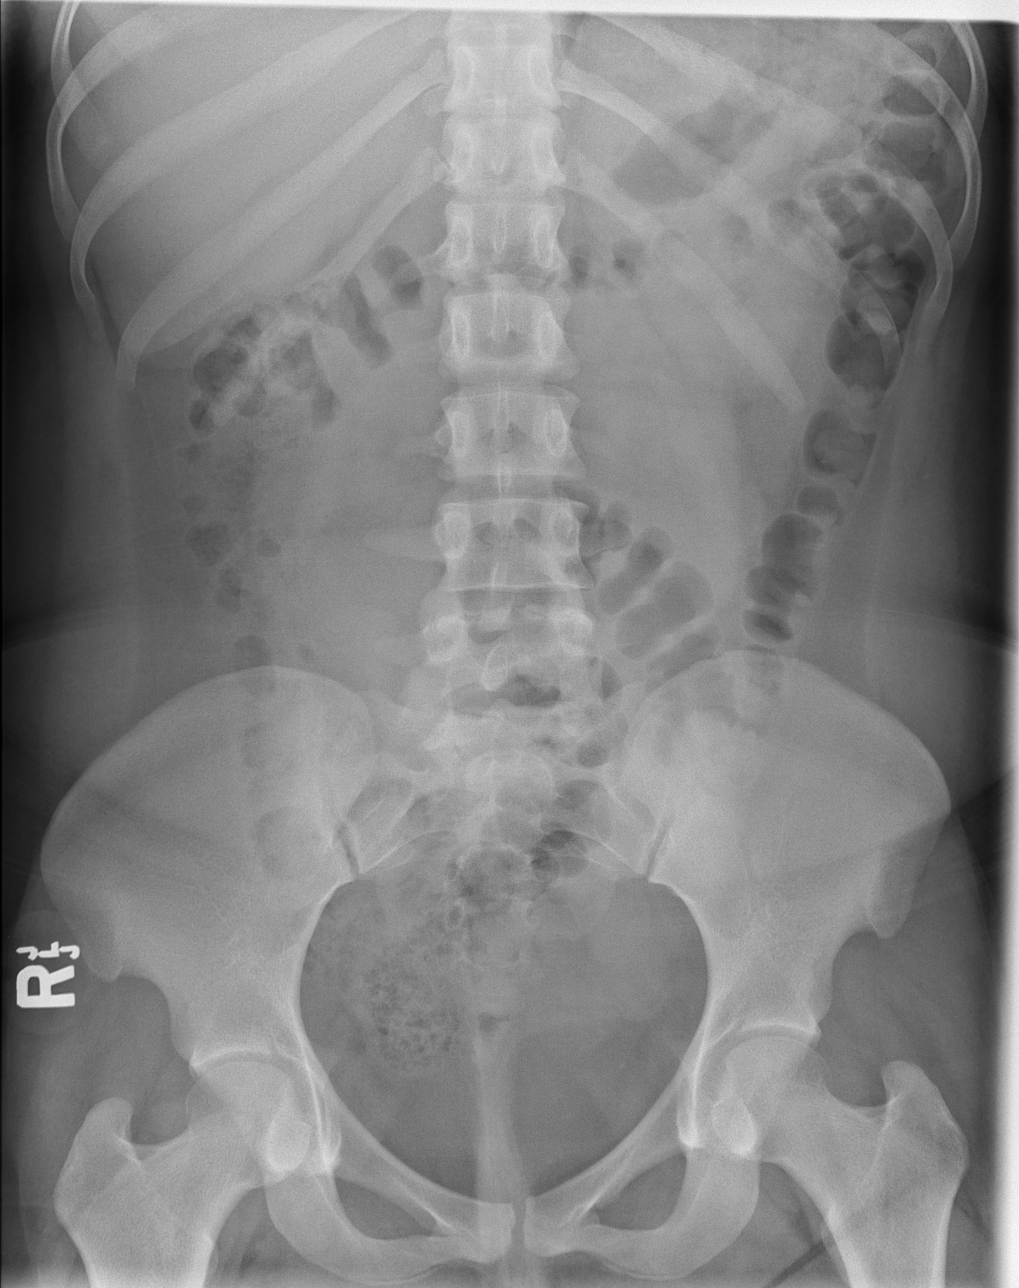

[3 of 3 positions shown; findings below may reference images not displayed]

FINDINGS: There is no evidence of dilated bowel loops or free intraperitoneal
air. No radiopaque calculi or other significant radiographic
abnormality is seen. Heart size and mediastinal contours are within
normal limits. Both lungs are clear.
IMPRESSION: Negative abdominal radiographs.  No acute cardiopulmonary disease.

## 2020-03-03 ENCOUNTER — Other Ambulatory Visit: Payer: Self-pay

## 2020-03-03 ENCOUNTER — Emergency Department (HOSPITAL_COMMUNITY): Payer: Medicare Other

## 2020-03-03 ENCOUNTER — Emergency Department (HOSPITAL_COMMUNITY)
Admission: EM | Admit: 2020-03-03 | Discharge: 2020-03-03 | Disposition: A | Discharge status: Home / Self Care | Payer: Medicare Other | Attending: Emergency Medicine | Admitting: Emergency Medicine

## 2020-03-03 DIAGNOSIS — R000 Tachycardia, unspecified: Secondary | ICD-10-CM | POA: Diagnosis not present

## 2020-03-03 DIAGNOSIS — Z975 Presence of (intrauterine) contraceptive device: Secondary | ICD-10-CM | POA: Insufficient documentation

## 2020-03-03 DIAGNOSIS — N730 Acute parametritis and pelvic cellulitis: Secondary | ICD-10-CM | POA: Insufficient documentation

## 2020-03-03 DIAGNOSIS — R102 Pelvic and perineal pain: Secondary | ICD-10-CM | POA: Diagnosis present

## 2020-03-03 LAB — I-STAT BETA HCG BLOOD, ED (MC, WL, AP ONLY): I-stat hCG, quantitative: <5 m[IU]/mL (ref <5)

## 2020-03-03 LAB — URINALYSIS, ROUTINE W REFLEX MICROSCOPIC
Bacteria, UA: NONE SEEN
Bilirubin Urine: NEGATIVE
Glucose, UA: NEGATIVE mg/dL
Hgb urine dipstick: NEGATIVE
Ketones, ur: NEGATIVE mg/dL
Nitrite: NEGATIVE
Protein, ur: NEGATIVE mg/dL
Specific Gravity, Urine: 1.024 (ref 1.005–1.030)
pH: 7 (ref 5.0–8.0)

## 2020-03-03 LAB — WET PREP, GENITAL
Clue Cells Wet Prep HPF POC: NONE SEEN
Sperm: NONE SEEN
Trich, Wet Prep: NONE SEEN
Yeast Wet Prep HPF POC: NONE SEEN

## 2020-03-03 LAB — I-STAT CHEM 8, ED
BUN: 12 mg/dL (ref 6–20)
Calcium, Ion: 1.23 mmol/L (ref 1.15–1.40)
Chloride: 106 mmol/L (ref 98–111)
Creatinine, Ser: 0.8 mg/dL (ref 0.44–1.00)
Glucose, Bld: 97 mg/dL (ref 70–99)
HCT: 39 % (ref 36.0–46.0)
Hemoglobin: 13.3 g/dL (ref 12.0–15.0)
Potassium: 4.1 mmol/L (ref 3.5–5.1)
Sodium: 141 mmol/L (ref 135–145)
TCO2: 25 mmol/L (ref 22–32)

## 2020-03-03 MED ORDER — LIDOCAINE HCL (PF) 1 % IJ SOLN
INTRAMUSCULAR | Status: AC
  Administered 2020-03-03: 2.1 mL
  Filled 2020-03-03: qty 5

## 2020-03-03 MED ORDER — NAPROXEN 500 MG PO TABS: 500.0000 mg | ORAL_TABLET | Freq: Two times a day (BID) | ORAL | 0 refills | Status: DC

## 2020-03-03 MED ORDER — DOXYCYCLINE HYCLATE 100 MG PO CAPS: 100.0000 mg | ORAL_CAPSULE | Freq: Two times a day (BID) | ORAL | 0 refills | Status: DC

## 2020-03-03 MED ORDER — CEFTRIAXONE SODIUM 500 MG IJ SOLR
500.0000 mg | Freq: Once | INTRAMUSCULAR | Status: AC
  Administered 2020-03-03: 500 mg via INTRAMUSCULAR
  Filled 2020-03-03: qty 500

## 2020-03-03 MED ORDER — DOXYCYCLINE HYCLATE 100 MG PO TABS
100.0000 mg | ORAL_TABLET | Freq: Once | ORAL | Status: AC
  Administered 2020-03-03: 100 mg via ORAL
  Filled 2020-03-03: qty 1

## 2020-03-03 NOTE — ED Provider Notes (Signed)
[facility] EMERGENCY DEPARTMENT Provider Note   CSN: [id number] Arrival date & time: 03/03/20  0053     History Chief Complaint  Patient presents with  . Pelvic Pain    Brittany [name] is a 19 y.o. female.  19 year old female presents to the emergency department for evaluation of lower abdominal cramping.  She reports that cramping has been constant since Monday.  She initially had vaginal bleeding as well, but this has slowed and largely stopped.  States that cramping has persisted and is unrelieved with ibuprofen.  She is concerned that her symptoms may be due to her IUD which was placed 1 month ago.  She is followed by [facility].  Denies fever, vomiting, urinary symptoms, abdominal surgeries.  The history is provided by the patient. No language interpreter was used.  Pelvic Pain       Past Medical History:  Diagnosis Date  . ADHD (attention deficit hyperactivity disorder)   . Attention deficit hyperactivity disorder (ADHD) 08/01/2015  . Bipolar 1 disorder (HCC)   . Bipolar disorder (HCC)   . Depression   . Depressive disorder 08/01/2015  . Headache   . Seasonal allergies     Patient Active Problem List   Diagnosis Date Noted  . Defiant behavior 07/06/2018  . Attention-deficit hyperactivity disorder, predominantly hyperactive type   . Homicidal ideation   . Attention deficit hyperactivity disorder (ADHD) 08/01/2015  . Depressive disorder 08/01/2015  . DMDD (disruptive mood dysregulation disorder) (HCC) 07/26/2015  . Tension headache 05/23/2014  . Depression 05/23/2014  . Anxiety state 05/23/2014    Past Surgical History:  Procedure Laterality Date  . NO PAST SURGERIES       OB History   No obstetric history on file.     Family History  Adopted: Yes  Problem Relation Age of Onset  . HIV Mother        Died at 7    Social History   Tobacco Use  . Smoking status: Never Smoker  . Smokeless tobacco: Never Used  Substance Use  Topics  . Alcohol use: No    Alcohol/week: 0.0 standard drinks  . Drug use: No    Home Medications Prior to Admission medications   Medication Sig Start Date End Date Taking? Authorizing Provider  ARIPiprazole (ABILIFY) 20 MG tablet Take 20 mg by mouth at bedtime.  04/29/18   [provider]  doxycycline (VIBRAMYCIN) 100 MG capsule Take 1 capsule (100 mg total) by mouth 2 (two) times daily. 03/03/20   Antony Madura, PA-C  lamoTRIgine (LAMICTAL) 25 MG tablet Take 50 mg by mouth daily.  06/07/19   [provider]  naproxen (NAPROSYN) 500 MG tablet Take 1 tablet (500 mg total) by mouth 2 (two) times daily. 03/03/20   Antony Madura, PA-C  omeprazole (PRILOSEC) 10 MG capsule Take 10 mg by mouth daily.    [provider]  QUEtiapine (SEROQUEL) 100 MG tablet Take 100 mg by mouth at bedtime.  06/06/19   [provider]    Allergies    Patient has no known allergies.  Review of Systems   Review of Systems  Genitourinary: Positive for pelvic pain.  Ten systems reviewed and are negative for acute change, except as noted in the HPI.    Physical Exam Updated Vital Signs BP 110/60 (BP Location: Right Arm)   Pulse 82   Temp 98.6 F (37 C) (Oral)   Resp 16   SpO2 100%   Physical Exam Vitals  and nursing note reviewed.  Constitutional:      General: She is not in acute distress.    Appearance: She is well-developed. She is not diaphoretic.     Comments: Nontoxic appearing and in NAD  HENT:     Head: Normocephalic and atraumatic.  Eyes:     General: No scleral icterus.    Conjunctiva/sclera: Conjunctivae normal.  Cardiovascular:     Rate and Rhythm: Regular rhythm. Tachycardia present.     Pulses: Normal pulses.  Pulmonary:     Effort: Pulmonary effort is normal. No respiratory distress.     Comments: Respirations even and unlabored Abdominal:     Comments: Abdomen soft, nondistended. Suprapubic TTP. No peritoneal signs or guarding.  Genitourinary:     Labia:        Right: No rash, tenderness, lesion or injury.        Left: No rash, tenderness, lesion or injury.      Vagina: Vaginal discharge (scant) present.     Cervix: Cervical motion tenderness and friability present. No cervical bleeding.     Comments: IUD strings visible. Musculoskeletal:        General: Normal range of motion.     Cervical back: Normal range of motion.  Skin:    General: Skin is warm and dry.     Coloration: Skin is not pale.     Findings: No erythema or rash.  Neurological:     Mental Status: She is alert and oriented to person, place, and time.  Psychiatric:        Behavior: Behavior normal.     ED Results / Procedures / Treatments   Labs (all labs ordered are listed, but only abnormal results are displayed) Labs Reviewed  WET PREP, GENITAL - Abnormal; Notable for the following components:      Result Value   WBC, Wet Prep HPF POC MODERATE (*)    All other components within normal limits  URINALYSIS, ROUTINE W REFLEX MICROSCOPIC - Abnormal; Notable for the following components:   Leukocytes,Ua TRACE (*)    All other components within normal limits  I-STAT BETA HCG BLOOD, ED ([facility], [facility], [facility] ONLY)  I-STAT CHEM 8, ED  GC/CHLAMYDIA PROBE AMP ([place]) NOT AT [facility]    EKG None  Radiology US PELVIC COMPLETE W TRANSVAGINAL AND TORSION R/O  Result Date: 03/03/2020 CLINICAL DATA:  Initial evaluation for acute pelvic pain for 1 week. EXAM: TRANSABDOMINAL AND TRANSVAGINAL ULTRASOUND OF PELVIS DOPPLER ULTRASOUND OF OVARIES TECHNIQUE: Both transabdominal and transvaginal ultrasound examinations of the pelvis were performed. Transabdominal technique was performed for global imaging of the pelvis including uterus, ovaries, adnexal regions, and pelvic cul-de-sac. It was necessary to proceed with endovaginal exam following the transabdominal exam to visualize the uterus, endometrium, and ovaries color and duplex Doppler ultrasound was utilized to evaluate blood  flow to the ovaries. COMPARISON:  Prior ultrasound from 08/15/2019. FINDINGS: Uterus Measurements: 8.2 x 2.8 x 4.1 cm = volume: 49.1 ML. Uterus is anteverted. No discrete fibroid or other mass. Endometrium Thickness: 4.8 mm. No focal abnormality visualized. IUD in appropriate position within the endometrial cavity. Right ovary Measurements: 3.7 x 2.2 x 3.4 cm = volume: 14.2 mL. Normal appearance/no adnexal mass. 2.4 cm simple cyst with internal daughter cyst, most consistent with a normal physiologic follicular cyst/dominant follicle. Left ovary Measurements: 3.7 x 1.7 x 2.9 cm = volume: 9.4 mL. Normal appearance/no adnexal mass. 1.8 cm elongated cystic lesion most consistent with a collapsing dominant follicle. Pulsed  Doppler evaluation of both ovaries demonstrates normal low-resistance arterial and venous waveforms. Other findings No abnormal free fluid. IMPRESSION: Normal pelvic ultrasound for age. No evidence for torsion or other acute abnormality. Electronically Signed   By: Rise Mu M.D.   On: 03/03/2020 06:39    Procedures Procedures (including critical care time)  Medications Ordered in ED Medications  cefTRIAXone (ROCEPHIN) injection 500 mg (500 mg Intramuscular Given 03/03/20 0652)  doxycycline (VIBRA-TABS) tablet 100 mg (100 mg Oral Given 03/03/20 0652)  lidocaine (PF) (XYLOCAINE) 1 % injection (2.1 mLs  Given 03/03/20 3244)    ED Course  I have reviewed the triage vital signs and the nursing notes.  Pertinent labs & imaging results that were available during my care of the patient were reviewed by me and considered in my medical decision making (see chart for details).    MDM Rules/Calculators/A&P                          19 year old female presenting for pelvic pain.  Expresses concern about IUD placement which was inserted 1 month ago.  Her strings are present and ultrasound without concern for malposition.  She does have a history of unprotected sexual intercourse  with moderate white blood cells on wet prep.  Plan for coverage for pelvic inflammatory disease with Rocephin and doxycycline.  Encouraged follow-up with her OB/GYN.  Return precautions discussed and provided.  Patient discharged in stable condition with no unaddressed concerns.  Vitals:   03/03/20 0059 03/03/20 0327 03/03/20 0445 03/03/20 0656  BP: (!) 141/78 135/80 136/74 110/60  Pulse: (!) 140 98 96 82  Resp: 18 16 16 16   Temp: 98.6 F (37 C)   98.6 F (37 C)  TempSrc: Oral   Oral  SpO2: 99% 100% 100% 100%    Final Clinical Impression(s) / ED Diagnoses Final diagnoses:  Pelvic pain in female  PID (acute pelvic inflammatory disease)    Rx / DC Orders ED Discharge Orders         Ordered    doxycycline (VIBRAMYCIN) 100 MG capsule  2 times daily        03/03/20 0649    naproxen (NAPROSYN) 500 MG tablet  2 times daily        03/03/20 0649           03/05/20, PA-C 03/05/20 0516    Ward, 03/07/20, DO 03/05/20 [phone]

## 2020-03-03 NOTE — ED Notes (Signed)
Patient transported to Ultrasound 

## 2020-03-03 NOTE — Discharge Instructions (Signed)
Take doxycycline as prescribed until finished.  Your ultrasound did not show any abnormalities to your ovaries or uterus.  No concern for inappropriate placement of your IUD.  You are being treated for pelvic inflammatory disease.  Follow-up with your OB/GYN to ensure symptoms resolve.

## 2020-03-03 NOTE — ED Triage Notes (Signed)
Pt said she had and IUD placed in April and then 1 week ago she started having pressure and pain in her lower pelvic area. Pt said the IUD feels different and something is wrong. Bleeding

## 2020-03-05 LAB — GC/CHLAMYDIA PROBE AMP (~~LOC~~) NOT AT ARMC
Chlamydia: NEGATIVE
Comment: NEGATIVE
Comment: NORMAL
Neisseria Gonorrhea: NEGATIVE

## 2020-03-16 ENCOUNTER — Other Ambulatory Visit: Payer: Self-pay

## 2020-03-16 ENCOUNTER — Ambulatory Visit (INDEPENDENT_AMBULATORY_CARE_PROVIDER_SITE_OTHER): Payer: Medicare Other

## 2020-03-16 ENCOUNTER — Ambulatory Visit
Admission: EM | Admit: 2020-03-16 | Discharge: 2020-03-16 | Disposition: A | Discharge status: Home / Self Care | Payer: Medicare Other | Attending: Emergency Medicine | Admitting: Emergency Medicine

## 2020-03-16 DIAGNOSIS — R042 Hemoptysis: Secondary | ICD-10-CM | POA: Diagnosis not present

## 2020-03-16 DIAGNOSIS — R059 Cough, unspecified: Secondary | ICD-10-CM

## 2020-03-16 MED ORDER — DOXYCYCLINE HYCLATE 100 MG PO CAPS: 100.0000 mg | ORAL_CAPSULE | Freq: Two times a day (BID) | ORAL | 0 refills | Status: DC

## 2020-03-16 MED ORDER — DM-GUAIFENESIN ER 30-600 MG PO TB12: 1.0000 | ORAL_TABLET | Freq: Two times a day (BID) | ORAL | 0 refills | Status: DC

## 2020-03-16 MED ORDER — BENZONATATE 200 MG PO CAPS: 200.0000 mg | ORAL_CAPSULE | Freq: Three times a day (TID) | ORAL | 0 refills | Status: AC | PRN

## 2020-03-16 MED ORDER — AMOXICILLIN-POT CLAVULANATE 875-125 MG PO TABS: 1.0000 | ORAL_TABLET | Freq: Two times a day (BID) | ORAL | 0 refills | Status: AC

## 2020-03-16 NOTE — ED Provider Notes (Signed)
[name]    [name] Arrival date & time: 03/16/20  1320      History   Chief Complaint Chief Complaint  Patient presents with  . Cough    Since yesterday    HPI Brittany [name] is a 19 y.o. female history of bipolar disorder, presenting today for evaluation of a cough.  Patient reports that for the past week she has had Uri symptoms, cough and congestion.  Over the past day she has noticed blood-tinged mucus.  She has had a lot of back and rib cage soreness as well.  She denies any fevers.    HPI  Past Medical History:  Diagnosis Date  . ADHD (attention deficit hyperactivity disorder)   . Attention deficit hyperactivity disorder (ADHD) 08/01/2015  . Bipolar 1 disorder (HCC)   . Bipolar disorder (HCC)   . Depression   . Depressive disorder 08/01/2015  . Headache   . Seasonal allergies     Patient Active Problem List   Diagnosis Date Noted  . Defiant behavior 07/06/2018  . Attention-deficit hyperactivity disorder, predominantly hyperactive type   . Homicidal ideation   . Attention deficit hyperactivity disorder (ADHD) 08/01/2015  . Depressive disorder 08/01/2015  . DMDD (disruptive mood dysregulation disorder) (HCC) 07/26/2015  . Tension headache 05/23/2014  . Depression 05/23/2014  . Anxiety state 05/23/2014    Past Surgical History:  Procedure Laterality Date  . NO PAST SURGERIES      OB History   No obstetric history on file.      Home Medications    Prior to Admission medications   Medication Sig Start Date End Date Taking? Authorizing Provider  ARIPiprazole (ABILIFY) 20 MG tablet Take 20 mg by mouth at bedtime.  04/29/18  Yes [provider]  lamoTRIgine (LAMICTAL) 25 MG tablet Take 50 mg by mouth daily.  06/07/19  Yes [provider]  naproxen (NAPROSYN) 500 MG tablet Take 1 tablet (500 mg total) by mouth 2 (two) times daily. 03/03/20  Yes Antony Madura, PA-C  omeprazole (PRILOSEC) 10 MG capsule Take 10 mg  by mouth daily.   Yes [provider]  QUEtiapine (SEROQUEL) 100 MG tablet Take 100 mg by mouth at bedtime.  06/06/19  Yes [provider]  amoxicillin-clavulanate (AUGMENTIN) 875-125 MG tablet Take 1 tablet by mouth every 12 (twelve) hours for 7 days. 03/16/20 03/23/20  Tal Neer C, PA-C  benzonatate (TESSALON) 200 MG capsule Take 1 capsule (200 mg total) by mouth 3 (three) times daily as needed for up to 7 days for cough. 03/16/20 03/23/20  Aleck Locklin C, PA-C  dextromethorphan-guaiFENesin (MUCINEX DM) 30-600 MG 12hr tablet Take 1 tablet by mouth 2 (two) times daily. 03/16/20   Artha Chiasson, Junius Creamer, PA-C    Family History Family History  Adopted: Yes  Problem Relation Age of Onset  . HIV Mother        Died at 69    Social History Social History   Tobacco Use  . Smoking status: Never Smoker  . Smokeless tobacco: Never Used  Vaping Use  . Vaping Use: Never used  Substance Use Topics  . Alcohol use: No    Alcohol/week: 0.0 standard drinks  . Drug use: No     Allergies   Patient has no known allergies.   Review of Systems Review of Systems  Constitutional: Negative for activity change, appetite change, chills, fatigue and fever.  HENT: Positive for congestion and rhinorrhea. Negative for ear pain, sinus pressure, sore  throat and trouble swallowing.   Eyes: Negative for discharge and redness.  Respiratory: Positive for cough. Negative for chest tightness and shortness of breath.   Cardiovascular: Positive for chest pain.  Gastrointestinal: Negative for abdominal pain, diarrhea, nausea and vomiting.  Musculoskeletal: Positive for myalgias.  Skin: Negative for rash.  Neurological: Negative for dizziness, light-headedness and headaches.     Physical Exam Triage Vital Signs ED Triage Vitals  Enc Vitals Group     BP 03/16/20 1532 126/86     Pulse Rate 03/16/20 1532 83     Resp 03/16/20 1532 20     Temp 03/16/20 1532 98.6 F (37 C)     Temp  Source 03/16/20 1532 Oral     SpO2 03/16/20 1532 98 %     Weight --      Height --      Head Circumference --      Peak Flow --      Pain Score 03/16/20 1539 7     Pain Loc --      Pain Edu? --      Excl. in GC? --    No data found.  Updated Vital Signs BP 126/86 (BP Location: Left Arm)   Pulse 83   Temp 98.6 F (37 C) (Oral)   Resp 20   SpO2 98%   Visual Acuity Right Eye Distance:   Left Eye Distance:   Bilateral Distance:    Right Eye Near:   Left Eye Near:    Bilateral Near:     Physical Exam Vitals and nursing note reviewed.  Constitutional:      Appearance: She is well-developed and well-nourished.     Comments: No acute distress  HENT:     Head: Normocephalic and atraumatic.     Ears:     Comments: Bilateral ears without tenderness to palpation of external auricle, tragus and mastoid, EAC's without erythema or swelling, TM's with good bony landmarks and cone of light. Non erythematous.     Nose: Nose normal.     Mouth/Throat:     Comments: Oral mucosa pink and moist, no tonsillar enlargement or exudate. Posterior pharynx patent and nonerythematous, no uvula deviation or swelling. Normal phonation. Eyes:     Conjunctiva/sclera: Conjunctivae normal.  Cardiovascular:     Rate and Rhythm: Normal rate.  Pulmonary:     Effort: Pulmonary effort is normal. No respiratory distress.     Comments: Breathing comfortably at rest, CTABL, no wheezing, rales or other adventitious sounds auscultated Abdominal:     General: There is no distension.  Musculoskeletal:        General: Normal range of motion.     Cervical back: Neck supple.  Skin:    General: Skin is warm and dry.  Neurological:     Mental Status: She is alert and oriented to person, place, and time.  Psychiatric:        Mood and Affect: Mood and affect normal.      UC Treatments / Results  Labs (all labs ordered are listed, but only abnormal results are displayed) Labs Reviewed  NOVEL CORONAVIRUS,  NAA    EKG   Radiology DG Chest 2 View  Result Date: 03/16/2020 CLINICAL DATA:  Hemoptysis. EXAM: CHEST - 2 VIEW COMPARISON:  06/23/2018 FINDINGS: The heart size and mediastinal contours are within normal limits. Both lungs are clear. The visualized skeletal structures are unremarkable. IMPRESSION: No active cardiopulmonary disease. Electronically Signed   By: Marlan Palau M.D.  On: 03/16/2020 16:26    Procedures Procedures (including critical care time)  Medications Ordered in UC Medications - No data to display  Initial Impression / Assessment and Plan / UC Course  I have reviewed the triage vital signs and the nursing notes.  Pertinent labs & imaging results that were available during my care of the patient were reviewed by me and considered in my medical decision making (see chart for details).     X-ray negative, likely post viral cough, given symptoms x1 week, already on doxycycline will use Augmentin to go ahead and cover for sinusitis, continue symptomatic and supportive care of cough.  Rib cage soreness likely chest wall inflammation.  Continue to monitor,Discussed strict return precautions. Patient verbalized understanding and is agreeable with plan.  Final Clinical Impressions(s) / UC Diagnoses   Final diagnoses:  Cough     Discharge Instructions     Covid test pending Begin doxycycline twice daily for 1 week Tessalon for cough Mucinex DM Honey and hot tea Tylenol and ibuprofen for chest and back aches    ED Prescriptions    Medication Sig Dispense Auth. Provider   doxycycline (VIBRAMYCIN) 100 MG capsule  (Status: Discontinued) Take 1 capsule (100 mg total) by mouth 2 (two) times daily for 7 days. 14 capsule Taurus Alamo C, PA-C   benzonatate (TESSALON) 200 MG capsule Take 1 capsule (200 mg total) by mouth 3 (three) times daily as needed for up to 7 days for cough. 28 capsule Khai Torbert C, PA-C   dextromethorphan-guaiFENesin (MUCINEX DM) 30-600  MG 12hr tablet Take 1 tablet by mouth 2 (two) times daily. 20 tablet Omari Koslosky C, PA-C   amoxicillin-clavulanate (AUGMENTIN) 875-125 MG tablet Take 1 tablet by mouth every 12 (twelve) hours for 7 days. 14 tablet Terasa Orsini, [place] C, PA-C     PDMP not reviewed this encounter.   Lew Dawes, [place] 03/17/20 [phone]

## 2020-03-16 NOTE — ED Triage Notes (Signed)
Patient complains of coughing up blood since yesterday but has had a cough and tightness bilaterally under her ribcage x 1 week. Pt states she still smokes and that makes it worse. Pt is aox4 and ambulatory.

## 2020-03-16 NOTE — Discharge Instructions (Signed)
Covid test pending Begin doxycycline twice daily for 1 week Tessalon for cough Mucinex DM Honey and hot tea Tylenol and ibuprofen for chest and back aches

## 2020-03-17 ENCOUNTER — Emergency Department (HOSPITAL_COMMUNITY)
Admission: EM | Admit: 2020-03-17 | Discharge: 2020-03-17 | Disposition: A | Discharge status: Home / Self Care | Payer: Medicare Other | Attending: Emergency Medicine | Admitting: Emergency Medicine

## 2020-03-17 ENCOUNTER — Other Ambulatory Visit: Payer: Self-pay

## 2020-03-17 ENCOUNTER — Encounter (HOSPITAL_COMMUNITY): Payer: Self-pay | Admitting: *Deleted

## 2020-03-17 DIAGNOSIS — R042 Hemoptysis: Secondary | ICD-10-CM | POA: Diagnosis not present

## 2020-03-17 DIAGNOSIS — Z5321 Procedure and treatment not carried out due to patient leaving prior to being seen by health care provider: Secondary | ICD-10-CM | POA: Diagnosis not present

## 2020-03-17 DIAGNOSIS — R0781 Pleurodynia: Secondary | ICD-10-CM | POA: Diagnosis present

## 2020-03-17 LAB — SARS-COV-2, NAA 2 DAY TAT

## 2020-03-17 LAB — NOVEL CORONAVIRUS, NAA: SARS-CoV-2, NAA: NOT DETECTED

## 2020-03-17 NOTE — ED Triage Notes (Signed)
Pt co upper rib pain all the away around, cough with blood in sputum. Saw at [facility] yesterday given antibiotic but has not picked up prescription.

## 2020-03-17 NOTE — ED Notes (Signed)
Pt not present when called.

## 2020-03-17 NOTE — ED Notes (Signed)
Pt. Refused labs due to long wait time. Nurse aware.

## 2020-04-27 ENCOUNTER — Other Ambulatory Visit: Payer: Self-pay

## 2020-04-27 ENCOUNTER — Ambulatory Visit
Admission: EM | Admit: 2020-04-27 | Discharge: 2020-04-27 | Disposition: A | Discharge status: Home / Self Care | Payer: Medicare Other | Attending: Physician Assistant | Admitting: Physician Assistant

## 2020-04-27 DIAGNOSIS — Z20822 Contact with and (suspected) exposure to covid-19: Secondary | ICD-10-CM | POA: Diagnosis not present

## 2020-04-27 HISTORY — DX: Obesity, unspecified: E66.9

## 2020-04-27 MED ORDER — PSEUDOEPH-BROMPHEN-DM 30-2-10 MG/5ML PO SYRP
5.0000 mL | ORAL_SOLUTION | Freq: Four times a day (QID) | ORAL | 0 refills | Status: DC | PRN
Start: 2020-04-27 — End: 2020-06-26

## 2020-04-27 MED ORDER — IBUPROFEN 800 MG PO TABS: 800.0000 mg | ORAL_TABLET | Freq: Three times a day (TID) | ORAL | 0 refills | Status: DC

## 2020-04-27 NOTE — ED Provider Notes (Signed)
[name]    CSN: [id number] Arrival date & time: 04/27/20  1258      History   Chief Complaint Chief Complaint  Patient presents with  . Cough  . Generalized Body Aches    HPI Brittany [name] is a 20 y.o. female presenting today for evaluation of cough and body aches.  Reports symptoms began approximately 2 days ago.  Took a rapid test at work which was positive.  Using Tylenol to help with body aches.  Denies any chest pain or shortness of breath.  Mild congestion and sore throat.  Works at a nursing home, multiple residents positive.  HPI  Past Medical History:  Diagnosis Date  . ADHD (attention deficit hyperactivity disorder)   . Attention deficit hyperactivity disorder (ADHD) 08/01/2015  . Bipolar 1 disorder (HCC)   . Bipolar disorder (HCC)   . Depression   . Depressive disorder 08/01/2015  . Headache   . Obesity   . Seasonal allergies     Patient Active Problem List   Diagnosis Date Noted  . Defiant behavior 07/06/2018  . Attention-deficit hyperactivity disorder, predominantly hyperactive type   . Homicidal ideation   . Attention deficit hyperactivity disorder (ADHD) 08/01/2015  . Depressive disorder 08/01/2015  . DMDD (disruptive mood dysregulation disorder) (HCC) 07/26/2015  . Tension headache 05/23/2014  . Depression 05/23/2014  . Anxiety state 05/23/2014    Past Surgical History:  Procedure Laterality Date  . NO PAST SURGERIES      OB History   No obstetric history on file.      Home Medications    Prior to Admission medications   Medication Sig Start Date End Date Taking? Authorizing Provider  ARIPiprazole (ABILIFY) 20 MG tablet Take 20 mg by mouth at bedtime.  04/29/18  Yes [provider]  brompheniramine-pseudoephedrine-DM 30-2-10 MG/5ML syrup Take 5-10 mLs by mouth 4 (four) times daily as needed. 04/27/20  Yes Maila Dukes C, PA-C  ibuprofen (ADVIL) 800 MG tablet Take 1 tablet (800 mg total) by mouth 3 (three)  times daily. 04/27/20  Yes Isaias Dowson C, PA-C  lamoTRIgine (LAMICTAL) 25 MG tablet Take 50 mg by mouth daily.  06/07/19  Yes [provider]  omeprazole (PRILOSEC) 10 MG capsule Take 10 mg by mouth daily.   Yes [provider]  QUEtiapine (SEROQUEL) 100 MG tablet Take 100 mg by mouth at bedtime.  06/06/19  Yes [provider]    Family History Family History  Adopted: Yes  Problem Relation Age of Onset  . HIV Mother        Died at 23    Social History Social History   Tobacco Use  . Smoking status: Never Smoker  . Smokeless tobacco: Never Used  Vaping Use  . Vaping Use: Never used  Substance Use Topics  . Alcohol use: No    Alcohol/week: 0.0 standard drinks  . Drug use: No     Allergies   Patient has no known allergies.   Review of Systems Review of Systems  Constitutional: Negative for activity change, appetite change, chills, fatigue and fever.  HENT: Positive for congestion, rhinorrhea, sinus pressure and sore throat. Negative for ear pain and trouble swallowing.   Eyes: Negative for discharge and redness.  Respiratory: Positive for cough. Negative for chest tightness and shortness of breath.   Cardiovascular: Negative for chest pain.  Gastrointestinal: Negative for abdominal pain, diarrhea, nausea and vomiting.  Musculoskeletal: Positive for myalgias.  Skin: Negative for rash.  Neurological:  Negative for dizziness, light-headedness and headaches.     Physical Exam Triage Vital Signs ED Triage Vitals  Enc Vitals Group     BP --      Pulse --      Resp --      Temp --      Temp Source 04/27/20 1309 Oral     SpO2 --      Weight --      Height --      Head Circumference --      Peak Flow --      Pain Score 04/27/20 1303 0     Pain Loc --      Pain Edu? --      Excl. in GC? --    No data found.  Updated Vital Signs BP 127/67 (BP Location: Left Arm)   Pulse (!) 102   Temp 98.5 F (36.9 C) (Oral)   Resp 18   SpO2 94%    Visual Acuity Right Eye Distance:   Left Eye Distance:   Bilateral Distance:    Right Eye Near:   Left Eye Near:    Bilateral Near:     Physical Exam Vitals and nursing note reviewed.  Constitutional:      Appearance: She is well-developed and well-nourished.     Comments: No acute distress  HENT:     Head: Normocephalic and atraumatic.     Ears:     Comments: Bilateral ears without tenderness to palpation of external auricle, tragus and mastoid, EAC's without erythema or swelling, TM's with good bony landmarks and cone of light. Non erythematous.     Nose: Nose normal.     Mouth/Throat:     Comments: Oral mucosa pink and moist, no tonsillar enlargement or exudate. Posterior pharynx patent and nonerythematous, no uvula deviation or swelling. Normal phonation. Eyes:     Conjunctiva/sclera: Conjunctivae normal.  Cardiovascular:     Rate and Rhythm: Normal rate and regular rhythm.  Pulmonary:     Effort: Pulmonary effort is normal. No respiratory distress.     Comments: Breathing comfortably at rest, CTABL, no wheezing, rales or other adventitious sounds auscultated Abdominal:     General: There is no distension.  Musculoskeletal:        General: Normal range of motion.     Cervical back: Neck supple.  Skin:    General: Skin is warm and dry.  Neurological:     Mental Status: She is alert and oriented to person, place, and time.  Psychiatric:        Mood and Affect: Mood and affect normal.      UC Treatments / Results  Labs (all labs ordered are listed, but only abnormal results are displayed) Labs Reviewed  NOVEL CORONAVIRUS, NAA    EKG   Radiology No results found.  Procedures Procedures (including critical care time)  Medications Ordered in UC Medications - No data to display  Initial Impression / Assessment and Plan / UC Course  I have reviewed the triage vital signs and the nursing notes.  Pertinent labs & imaging results that were available  during my care of the patient were reviewed by me and considered in my medical decision making (see chart for details).     COVID test pending, suspected COVID, treating symptomatically and supportively.  Exam reassuring.  Discussed strict return precautions. Patient verbalized understanding and is agreeable with plan.  Final Clinical Impressions(s) / UC Diagnoses   Final diagnoses:  Suspected  COVID-19 virus infection     Discharge Instructions     COVID test pending, continue to quarantine Ibuprofen and Tylenol for fever body aches headaches May use cough syrup provided for cough and congestion Rest and fluids May continue over-the-counter medicines if preferred    ED Prescriptions    Medication Sig Dispense Auth. Provider   ibuprofen (ADVIL) 800 MG tablet Take 1 tablet (800 mg total) by mouth 3 (three) times daily. 21 tablet Ani Deoliveira C, PA-C   brompheniramine-pseudoephedrine-DM 30-2-10 MG/5ML syrup Take 5-10 mLs by mouth 4 (four) times daily as needed. 120 mL Zaccai Chavarin, [place] C, PA-C     PDMP not reviewed this encounter.   Lew Dawes, PA-C 04/27/20 1332

## 2020-04-27 NOTE — ED Triage Notes (Addendum)
Pt is here with a cough and bodyaches that started 2 days ago, pt has taken Tylenol to relieve discomfort.

## 2020-04-27 NOTE — Discharge Instructions (Signed)
COVID test pending, continue to quarantine Ibuprofen and Tylenol for fever body aches headaches May use cough syrup provided for cough and congestion Rest and fluids May continue over-the-counter medicines if preferred

## 2020-04-29 LAB — SARS-COV-2, NAA 2 DAY TAT

## 2020-04-29 LAB — NOVEL CORONAVIRUS, NAA: SARS-CoV-2, NAA: DETECTED — AB

## 2020-04-30 ENCOUNTER — Encounter: Payer: Self-pay | Admitting: Adult Health

## 2020-04-30 ENCOUNTER — Other Ambulatory Visit (HOSPITAL_COMMUNITY): Payer: Self-pay | Admitting: Adult Health

## 2020-04-30 DIAGNOSIS — U071 COVID-19: Secondary | ICD-10-CM

## 2020-04-30 NOTE — Progress Notes (Signed)
I connected by phone with Brittany [name] on 04/30/2020 at 3:54 PM to discuss the potential use of a new treatment for mild to moderate COVID-19 viral infection in non-hospitalized patients.  This patient is a 20 y.o. female that meets the FDA criteria for Emergency Use Authorization of COVID monoclonal antibody sotrovimab.  Has a (+) direct SARS-CoV-2 viral test result  Has mild or moderate COVID-19   Is NOT hospitalized due to COVID-19  Is within 10 days of symptom onset  Has at least one of the high risk factor(s) for progression to severe COVID-19 and/or hospitalization as defined in EUA.  Specific high risk criteria : BMI > 25 and Other high risk medical condition per CDC:  social risk   I have spoken and communicated the following to the patient or parent/caregiver regarding COVID monoclonal antibody treatment:  1. FDA has authorized the emergency use for the treatment of mild to moderate COVID-19 in adults and pediatric patients with positive results of direct SARS-CoV-2 viral testing who are 71 years of age and older weighing at least 40 kg, and who are at high risk for progressing to severe COVID-19 and/or hospitalization.  2. The significant known and potential risks and benefits of COVID monoclonal antibody, and the extent to which such potential risks and benefits are unknown.  3. Information on available alternative treatments and the risks and benefits of those alternatives, including clinical trials.  4. Patients treated with COVID monoclonal antibody should continue to self-isolate and use infection control measures (e.g., wear mask, isolate, social distance, avoid sharing personal items, clean and disinfect "high touch" surfaces, and frequent handwashing) according to CDC guidelines.   5. The patient or parent/caregiver has the option to accept or refuse COVID monoclonal antibody treatment. 6. Cost reviewed, sx onset 1/19  After reviewing this information with the  patient, the patient has agreed to receive one of the available covid 19 monoclonal antibodies and will be provided an appropriate fact sheet prior to infusion. Noreene Filbert, NP 04/30/2020 3:54 PM

## 2020-05-01 ENCOUNTER — Ambulatory Visit (HOSPITAL_COMMUNITY)
Admission: RE | Admit: 2020-05-01 | Discharge: 2020-05-01 | Disposition: A | Discharge status: Home / Self Care | Payer: Medicare Other | Source: Ambulatory Visit | Attending: Pulmonary Disease | Admitting: Pulmonary Disease

## 2020-05-01 DIAGNOSIS — Z6825 Body mass index (BMI) 25.0-25.9, adult: Secondary | ICD-10-CM | POA: Insufficient documentation

## 2020-05-01 DIAGNOSIS — U071 COVID-19: Secondary | ICD-10-CM

## 2020-05-01 MED ORDER — EPINEPHRINE 0.3 MG/0.3ML IJ SOAJ: 0.3000 mg | Freq: Once | INTRAMUSCULAR | Status: DC | PRN

## 2020-05-01 MED ORDER — SOTROVIMAB 500 MG/8ML IV SOLN
500.0000 mg | Freq: Once | INTRAVENOUS | Status: AC
  Administered 2020-05-01: 500 mg via INTRAVENOUS

## 2020-05-01 MED ORDER — METHYLPREDNISOLONE SODIUM SUCC 125 MG IJ SOLR: 125.0000 mg | Freq: Once | INTRAMUSCULAR | Status: DC | PRN

## 2020-05-01 MED ORDER — DIPHENHYDRAMINE HCL 50 MG/ML IJ SOLN: 50.0000 mg | Freq: Once | INTRAMUSCULAR | Status: DC | PRN

## 2020-05-01 MED ORDER — ALBUTEROL SULFATE HFA 108 (90 BASE) MCG/ACT IN AERS: 2.0000 | INHALATION_SPRAY | Freq: Once | RESPIRATORY_TRACT | Status: DC | PRN

## 2020-05-01 MED ORDER — SODIUM CHLORIDE 0.9 % IV SOLN: INTRAVENOUS | Status: DC | PRN

## 2020-05-01 MED ORDER — FAMOTIDINE IN NACL 20-0.9 MG/50ML-% IV SOLN: 20.0000 mg | Freq: Once | INTRAVENOUS | Status: DC | PRN

## 2020-05-01 NOTE — Progress Notes (Signed)
Diagnosis: COVID-19  Physician: Dr. Patrick Wright  Procedure: [facility] Infusion [facility] Med: Sotrovimab infusion - Provided patient with sotrovimab fact sheet for patients, parents, and caregivers prior to infusion.   Complications: No immediate complications noted  Discharge: Discharged home    

## 2020-05-01 NOTE — Discharge Instructions (Signed)

## 2020-05-01 NOTE — Progress Notes (Signed)
Patient reviewed Fact Sheet for Patients, Parents, and Caregivers for Emergency Use Authorization (EUA) of sotrovimab for the Treatment of Coronavirus. Patient also reviewed and is agreeable to the estimated cost of treatment. Patient is agreeable to proceed.   

## 2020-05-06 IMAGING — CR CHEST - 2 VIEW
2 series · 2 of 2 positions shown · non-contrast
Comparison: 06/07/2018

CLINICAL DATA: Pt transported from home via [facility]. Pt reports SOB
and lower back pain for last hour. Lung sounds clear, BATHADI. Seen at
PCP this morning, rx'd propanolol for BP and hasn't taken it yet. Hx
bipolar and anxiety. Taking meds as rx'd. BP 136/76, HR 84, 98% on
room air, RR 18.

EXAM:
CHEST - 2 VIEW

[w chest pa]
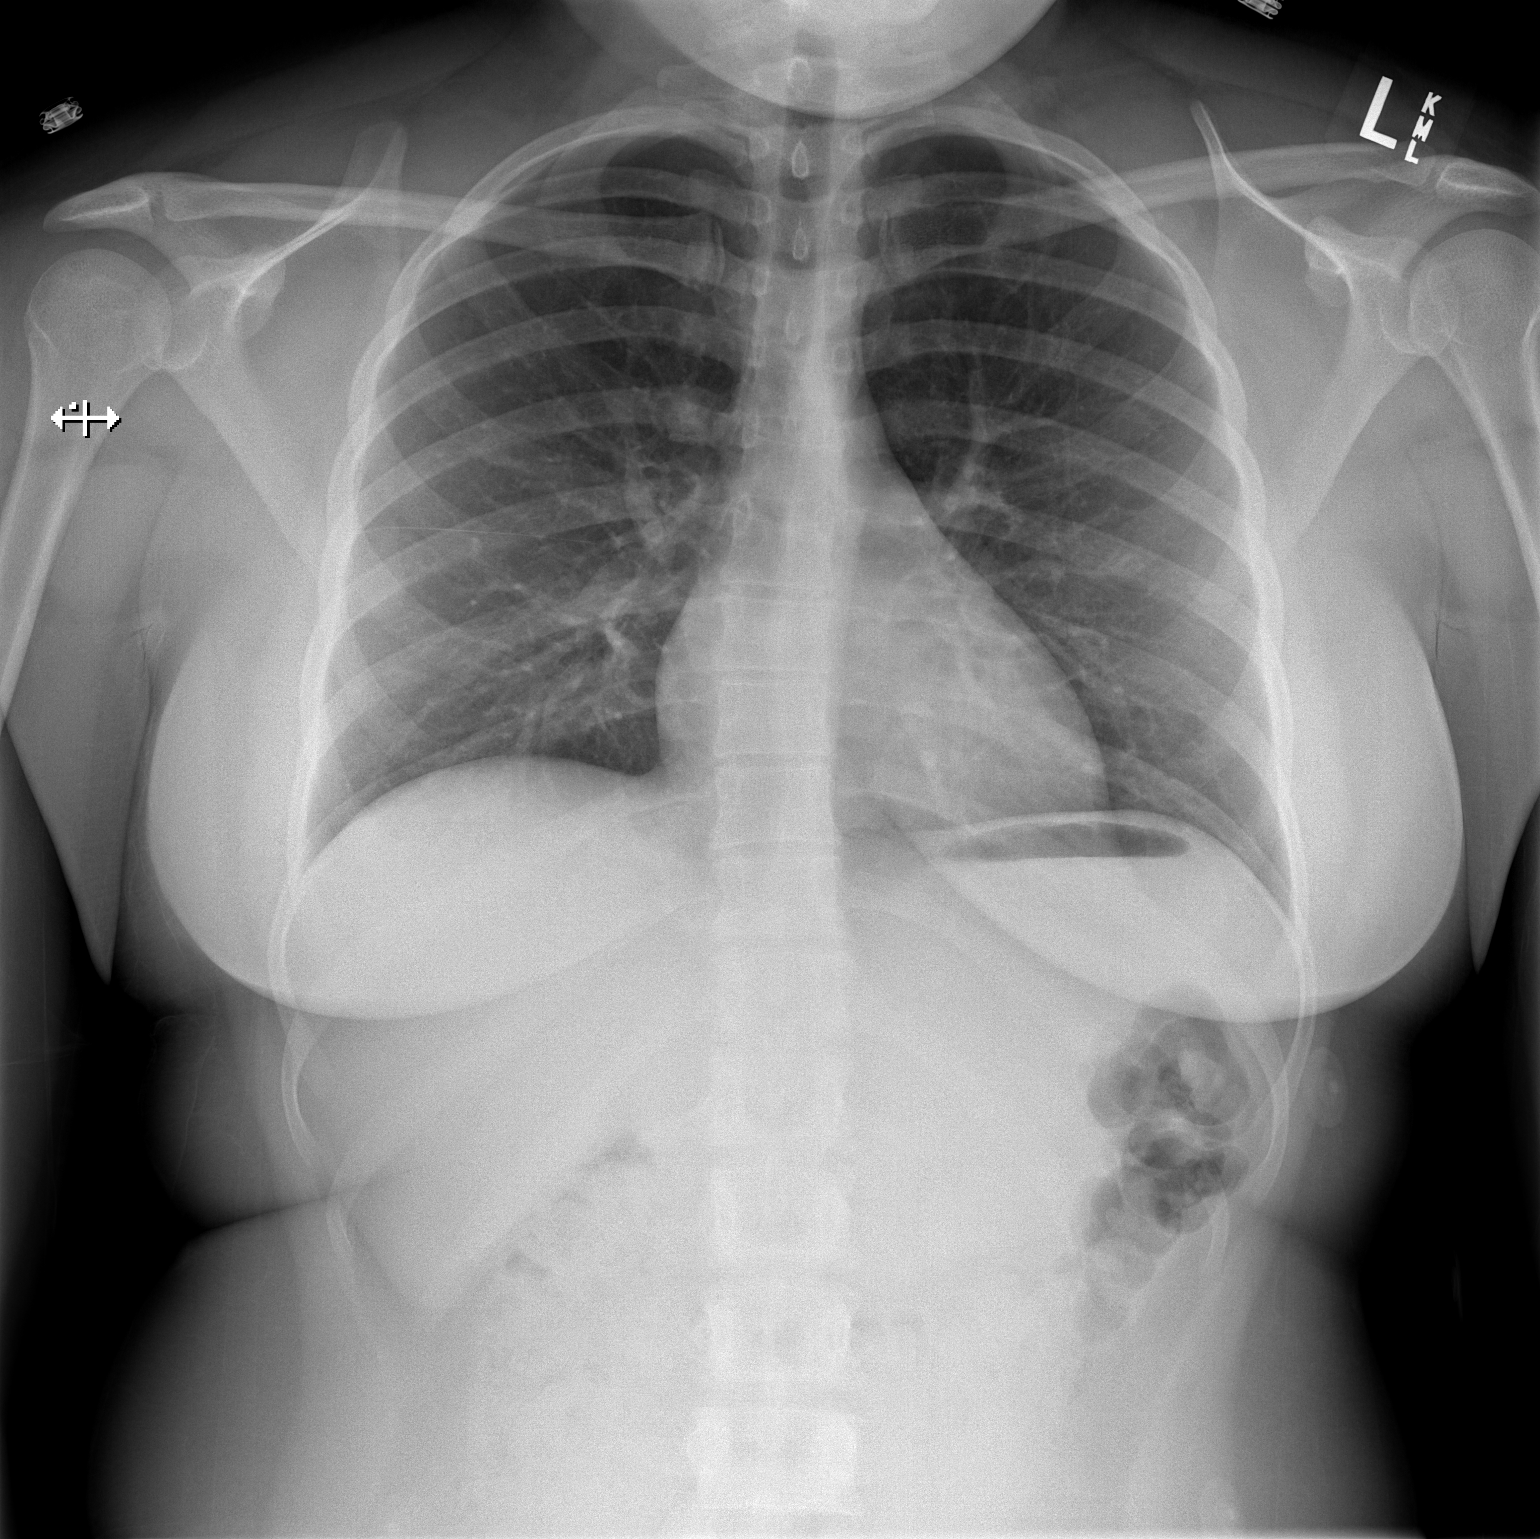

[w chest lat]
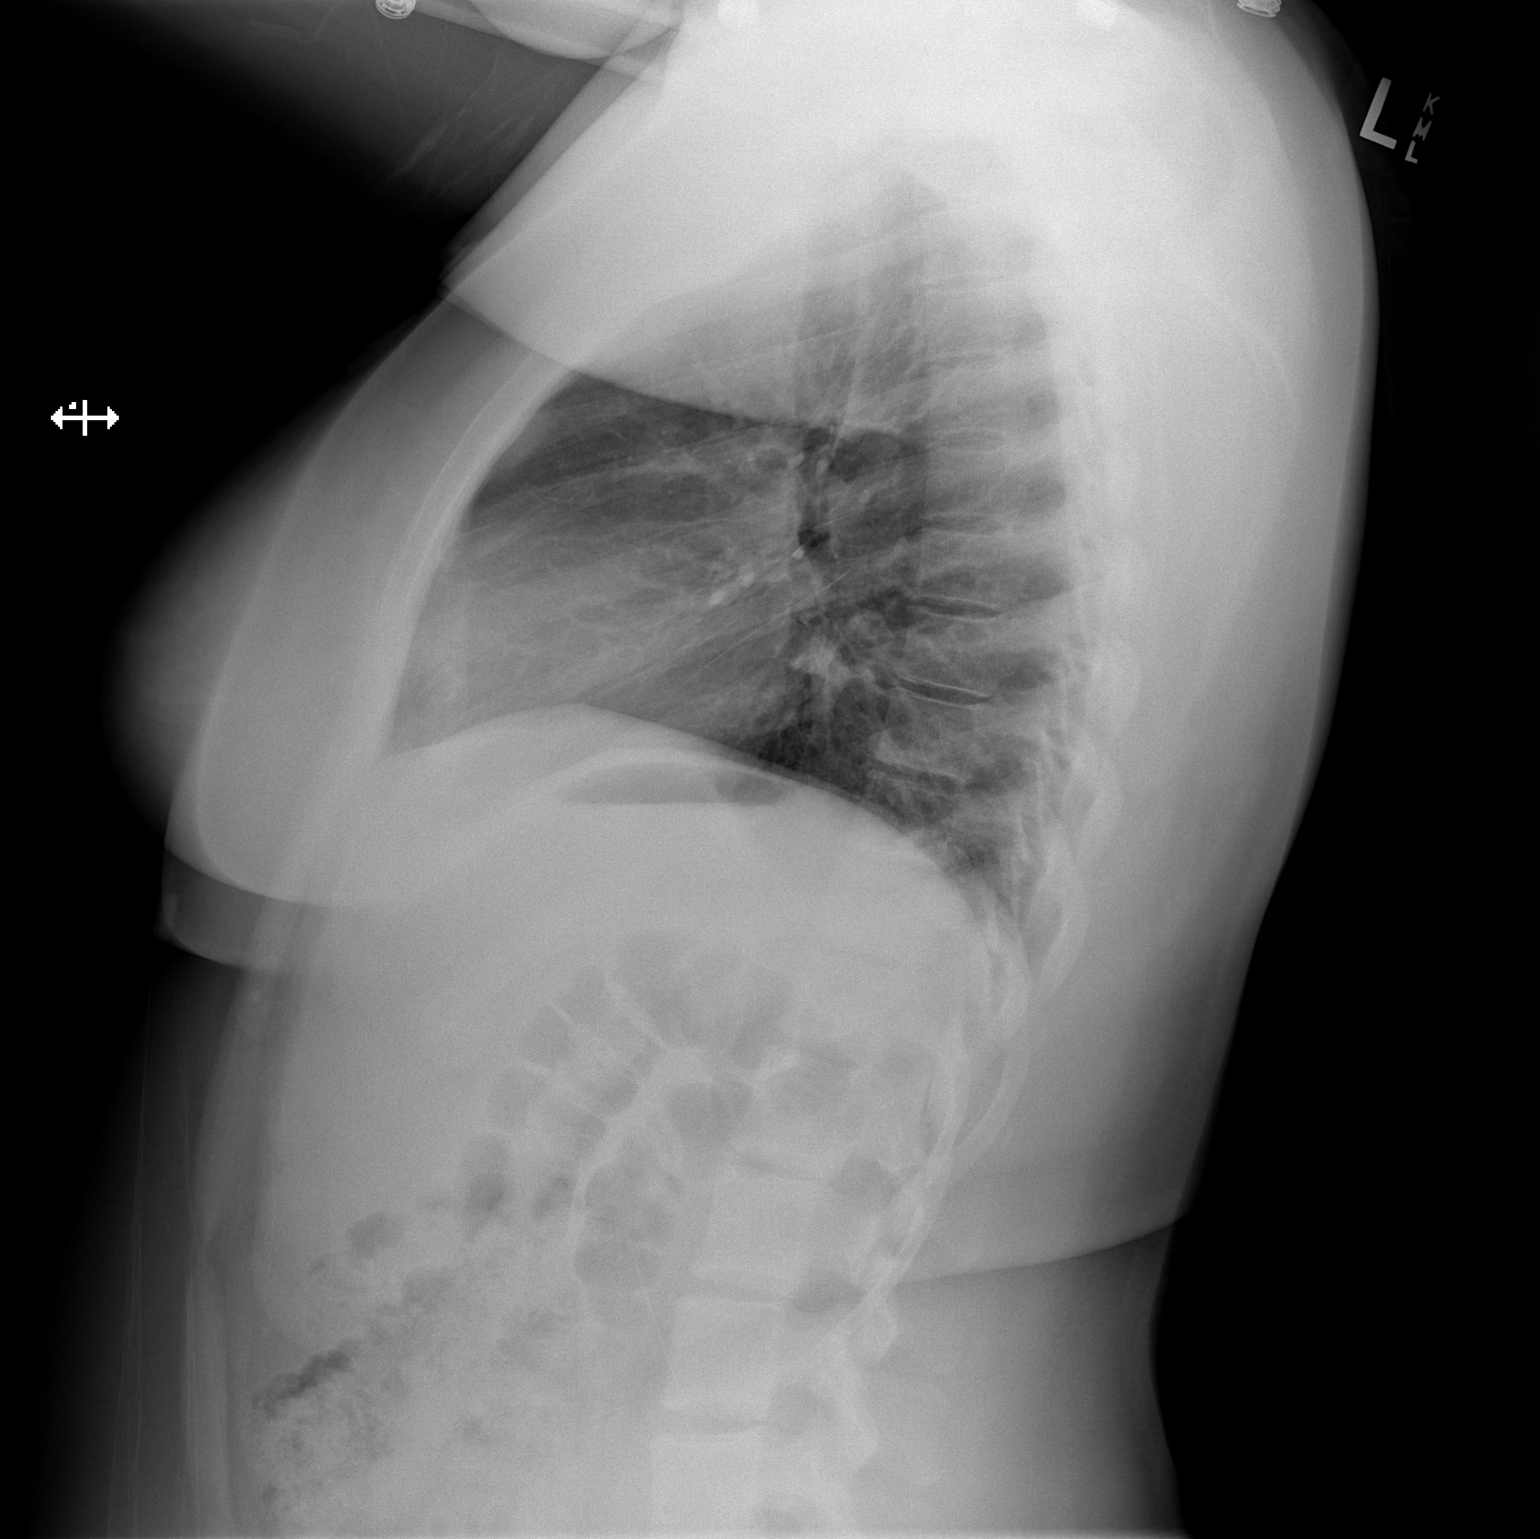

[2 of 2 positions shown; findings below may reference images not displayed]

FINDINGS: Normal heart, mediastinum and hila.

Clear lungs.  No pleural effusion or pneumothorax.

Skeletal structures are within normal limits.
IMPRESSION: Normal chest radiographs.

## 2020-05-07 ENCOUNTER — Ambulatory Visit
Admission: EM | Admit: 2020-05-07 | Discharge: 2020-05-07 | Disposition: A | Discharge status: Home / Self Care | Payer: Medicare Other | Attending: Emergency Medicine | Admitting: Emergency Medicine

## 2020-05-07 ENCOUNTER — Other Ambulatory Visit: Payer: Self-pay

## 2020-05-07 DIAGNOSIS — N898 Other specified noninflammatory disorders of vagina: Secondary | ICD-10-CM | POA: Diagnosis present

## 2020-05-07 MED ORDER — METRONIDAZOLE 500 MG PO TABS: 500.0000 mg | ORAL_TABLET | Freq: Two times a day (BID) | ORAL | 0 refills | Status: AC

## 2020-05-07 NOTE — ED Provider Notes (Signed)
[name]    CSN: [id number] Arrival date & time: 05/07/20  1258      History   Chief Complaint Chief Complaint  Patient presents with  . Vaginal Discharge    HPI Brittany [name] is a 20 y.o. female presenting today for evaluation of discharge. Reports white thick discharge with mild itching. Symptoms began 3 days ago.  Reports associated urinary odor, but denies dysuria or urgency.  Reports similar to prior yeast and BV, feels more likely BV.  HPI  Past Medical History:  Diagnosis Date  . ADHD (attention deficit hyperactivity disorder)   . Attention deficit hyperactivity disorder (ADHD) 08/01/2015  . Bipolar 1 disorder (HCC)   . Bipolar disorder (HCC)   . Depression   . Depressive disorder 08/01/2015  . Headache   . Obesity   . Seasonal allergies     Patient Active Problem List   Diagnosis Date Noted  . Defiant behavior 07/06/2018  . Attention-deficit hyperactivity disorder, predominantly hyperactive type   . Homicidal ideation   . Attention deficit hyperactivity disorder (ADHD) 08/01/2015  . Depressive disorder 08/01/2015  . DMDD (disruptive mood dysregulation disorder) (HCC) 07/26/2015  . Tension headache 05/23/2014  . Depression 05/23/2014  . Anxiety state 05/23/2014    Past Surgical History:  Procedure Laterality Date  . NO PAST SURGERIES      OB History   No obstetric history on file.      Home Medications    Prior to Admission medications   Medication Sig Start Date End Date Taking? Authorizing Provider  metroNIDAZOLE (FLAGYL) 500 MG tablet Take 1 tablet (500 mg total) by mouth 2 (two) times daily for 7 days. 05/07/20 05/14/20 Yes Aedan Geimer C, PA-C  ARIPiprazole (ABILIFY) 20 MG tablet Take 20 mg by mouth at bedtime.  04/29/18   [provider]  brompheniramine-pseudoephedrine-DM 30-2-10 MG/5ML syrup Take 5-10 mLs by mouth 4 (four) times daily as needed. 04/27/20   Aariz Maish C, PA-C  ibuprofen (ADVIL) 800 MG tablet  Take 1 tablet (800 mg total) by mouth 3 (three) times daily. 04/27/20   Jacquelyne Quarry C, PA-C  lamoTRIgine (LAMICTAL) 25 MG tablet Take 50 mg by mouth daily.  06/07/19   [provider]  omeprazole (PRILOSEC) 10 MG capsule Take 10 mg by mouth daily.    [provider]  QUEtiapine (SEROQUEL) 100 MG tablet Take 100 mg by mouth at bedtime.  06/06/19   [provider]    Family History Family History  Adopted: Yes  Problem Relation Age of Onset  . HIV Mother        Died at 61    Social History Social History   Tobacco Use  . Smoking status: Never Smoker  . Smokeless tobacco: Never Used  Vaping Use  . Vaping Use: Never used  Substance Use Topics  . Alcohol use: No    Alcohol/week: 0.0 standard drinks  . Drug use: No     Allergies   Patient has no known allergies.   Review of Systems Review of Systems  Constitutional: Negative for fever.  Respiratory: Negative for shortness of breath.   Cardiovascular: Negative for chest pain.  Gastrointestinal: Negative for abdominal pain, diarrhea, nausea and vomiting.  Genitourinary: Positive for vaginal discharge. Negative for dysuria, flank pain, genital sores, hematuria, menstrual problem, vaginal bleeding and vaginal pain.  Musculoskeletal: Negative for back pain.  Skin: Negative for rash.  Neurological: Negative for dizziness, light-headedness and headaches.     Physical Exam  Triage Vital Signs ED Triage Vitals [05/07/20 1338]  Enc Vitals Group     BP 116/60     Pulse Rate 75     Resp 18     Temp 98.8 F (37.1 C)     Temp Source Oral     SpO2 97 %     Weight      Height      Head Circumference      Peak Flow      Pain Score 0     Pain Loc      Pain Edu?      Excl. in GC?    No data found.  Updated Vital Signs BP 116/60 (BP Location: Left Arm)   Pulse 75   Temp 98.8 F (37.1 C) (Oral)   Resp 18   SpO2 97%   Visual Acuity Right Eye Distance:   Left Eye Distance:   Bilateral  Distance:    Right Eye Near:   Left Eye Near:    Bilateral Near:     Physical Exam Vitals and nursing note reviewed.  Constitutional:      Appearance: She is well-developed and well-nourished.     Comments: No acute distress  HENT:     Head: Normocephalic and atraumatic.     Nose: Nose normal.  Eyes:     Conjunctiva/sclera: Conjunctivae normal.  Cardiovascular:     Rate and Rhythm: Normal rate.  Pulmonary:     Effort: Pulmonary effort is normal. No respiratory distress.  Abdominal:     General: There is no distension.  Musculoskeletal:        General: Normal range of motion.     Cervical back: Neck supple.  Skin:    General: Skin is warm and dry.  Neurological:     Mental Status: She is alert and oriented to person, place, and time.  Psychiatric:        Mood and Affect: Mood and affect normal.      UC Treatments / Results  Labs (all labs ordered are listed, but only abnormal results are displayed) Labs Reviewed  CERVICOVAGINAL ANCILLARY ONLY    EKG   Radiology No results found.  Procedures Procedures (including critical care time)  Medications Ordered in UC Medications - No data to display  Initial Impression / Assessment and Plan / UC Course  I have reviewed the triage vital signs and the nursing notes.  Pertinent labs & imaging results that were available during my care of the patient were reviewed by me and considered in my medical decision making (see chart for details).     Vaginal swab pending, empirically treating for BV today with Flagyl.  Will call with results and alter therapy as needed.  Discussed strict return precautions. Patient verbalized understanding and is agreeable with plan.  Final Clinical Impressions(s) / UC Diagnoses   Final diagnoses:  Vaginal discharge     Discharge Instructions     Begin flagyl twice daily for 1 week, no alcohol while taking.   We are testing you for Gonorrhea, Chlamydia, Trichomonas, Yeast and  Bacterial Vaginosis. We will call you if anything is positive and let you know if you require any further treatment. Please inform partners of any positive results.   Please return if symptoms not improving with treatment, development of fever, nausea, vomiting, abdominal pain.     ED Prescriptions    Medication Sig Dispense Auth. Provider   metroNIDAZOLE (FLAGYL) 500 MG tablet Take 1 tablet (500  mg total) by mouth 2 (two) times daily for 7 days. 14 tablet Shanna Un, [place] C, PA-C     PDMP not reviewed this encounter.   Lew Dawes, [place] 05/07/20 1411

## 2020-05-07 NOTE — ED Triage Notes (Signed)
Pt c/o white vaginal discharge with itching and slight odor for past 2 days. States feels like a yeast infection. Pt denies having un protective intercourse or any concerns for STD.

## 2020-05-07 NOTE — Discharge Instructions (Addendum)
Begin flagyl twice daily for 1 week, no alcohol while taking.   We are testing you for Gonorrhea, Chlamydia, Trichomonas, Yeast and Bacterial Vaginosis. We will call you if anything is positive and let you know if you require any further treatment. Please inform partners of any positive results.   Please return if symptoms not improving with treatment, development of fever, nausea, vomiting, abdominal pain.

## 2020-05-09 LAB — CERVICOVAGINAL ANCILLARY ONLY
Bacterial Vaginitis (gardnerella): POSITIVE — AB
Candida Glabrata: NEGATIVE
Candida Vaginitis: NEGATIVE
Chlamydia: NEGATIVE
Comment: NEGATIVE
Comment: NEGATIVE
Comment: NEGATIVE
Comment: NEGATIVE
Comment: NEGATIVE
Comment: NORMAL
Neisseria Gonorrhea: NEGATIVE
Trichomonas: NEGATIVE

## 2020-06-11 ENCOUNTER — Emergency Department (HOSPITAL_COMMUNITY)
Admission: EM | Admit: 2020-06-11 | Discharge: 2020-06-11 | Disposition: A | Discharge status: Home / Self Care | Payer: Medicare Other | Attending: Emergency Medicine | Admitting: Emergency Medicine

## 2020-06-11 ENCOUNTER — Emergency Department (HOSPITAL_COMMUNITY): Payer: Medicare Other

## 2020-06-11 ENCOUNTER — Encounter (HOSPITAL_COMMUNITY): Payer: Self-pay

## 2020-06-11 DIAGNOSIS — Z79899 Other long term (current) drug therapy: Secondary | ICD-10-CM | POA: Insufficient documentation

## 2020-06-11 DIAGNOSIS — R110 Nausea: Secondary | ICD-10-CM | POA: Diagnosis present

## 2020-06-11 DIAGNOSIS — R531 Weakness: Secondary | ICD-10-CM

## 2020-06-11 LAB — CBC WITH DIFFERENTIAL/PLATELET
Abs Immature Granulocytes: 0.02 10*3/uL (ref 0.00–0.07)
Basophils Absolute: 0 10*3/uL (ref 0.0–0.1)
Basophils Relative: 1 %
Eosinophils Absolute: 0 10*3/uL (ref 0.0–0.5)
Eosinophils Relative: 0 %
HCT: 37.5 % (ref 36.0–46.0)
Hemoglobin: 11.7 g/dL — ABNORMAL LOW (ref 12.0–15.0)
Immature Granulocytes: 0 %
Lymphocytes Relative: 34 %
Lymphs Abs: 2.3 10*3/uL (ref 0.7–4.0)
MCH: 27.5 pg (ref 26.0–34.0)
MCHC: 31.2 g/dL (ref 30.0–36.0)
MCV: 88.2 fL (ref 80.0–100.0)
Monocytes Absolute: 0.5 10*3/uL (ref 0.1–1.0)
Monocytes Relative: 8 %
Neutro Abs: 4 10*3/uL (ref 1.7–7.7)
Neutrophils Relative %: 57 %
Platelets: 287 10*3/uL (ref 150–400)
RBC: 4.25 MIL/uL (ref 3.87–5.11)
RDW: 16.7 % — ABNORMAL HIGH (ref 11.5–15.5)
WBC: 6.9 10*3/uL (ref 4.0–10.5)
nRBC: 0 % (ref 0.0–0.2)

## 2020-06-11 LAB — URINALYSIS, ROUTINE W REFLEX MICROSCOPIC
Bilirubin Urine: NEGATIVE
Glucose, UA: NEGATIVE mg/dL
Hgb urine dipstick: NEGATIVE
Ketones, ur: NEGATIVE mg/dL
Leukocytes,Ua: NEGATIVE
Nitrite: NEGATIVE
Protein, ur: NEGATIVE mg/dL
Specific Gravity, Urine: 1.02 (ref 1.005–1.030)
pH: 7 (ref 5.0–8.0)

## 2020-06-11 LAB — COMPREHENSIVE METABOLIC PANEL
ALT: 14 U/L (ref 0–44)
AST: 14 U/L — ABNORMAL LOW (ref 15–41)
Albumin: 3.5 g/dL (ref 3.5–5.0)
Alkaline Phosphatase: 63 U/L (ref 38–126)
Anion gap: 9 (ref 5–15)
BUN: 10 mg/dL (ref 6–20)
CO2: 23 mmol/L (ref 22–32)
Calcium: 8.6 mg/dL — ABNORMAL LOW (ref 8.9–10.3)
Chloride: 112 mmol/L — ABNORMAL HIGH (ref 98–111)
Creatinine, Ser: 0.67 mg/dL (ref 0.44–1.00)
GFR, Estimated: >60 mL/min (ref >60)
Glucose, Bld: 128 mg/dL — ABNORMAL HIGH (ref 70–99)
Potassium: 3.9 mmol/L (ref 3.5–5.1)
Sodium: 144 mmol/L (ref 135–145)
Total Bilirubin: 0.5 mg/dL (ref 0.3–1.2)
Total Protein: 6.5 g/dL (ref 6.5–8.1)

## 2020-06-11 LAB — RAPID URINE DRUG SCREEN, HOSP PERFORMED
Amphetamines: NOT DETECTED
Barbiturates: NOT DETECTED
Benzodiazepines: NOT DETECTED
Cocaine: NOT DETECTED
Opiates: NOT DETECTED
Tetrahydrocannabinol: POSITIVE — AB

## 2020-06-11 LAB — I-STAT BETA HCG BLOOD, ED (MC, WL, AP ONLY): I-stat hCG, quantitative: <5 m[IU]/mL (ref <5)

## 2020-06-11 LAB — LIPASE, BLOOD: Lipase: 38 U/L (ref 11–51)

## 2020-06-11 LAB — ETHANOL: Alcohol, Ethyl (B): <10 mg/dL (ref <10)

## 2020-06-11 MED ORDER — SODIUM CHLORIDE 0.9 % IV BOLUS
1000.0000 mL | Freq: Once | INTRAVENOUS | Status: AC
  Administered 2020-06-11: 1000 mL via INTRAVENOUS

## 2020-06-11 MED ORDER — ONDANSETRON HCL 4 MG/2ML IJ SOLN
4.0000 mg | Freq: Once | INTRAMUSCULAR | Status: AC
  Administered 2020-06-11: 4 mg via INTRAVENOUS
  Filled 2020-06-11: qty 2

## 2020-06-11 MED ORDER — ONDANSETRON 4 MG PO TBDP: 4.0000 mg | ORAL_TABLET | Freq: Three times a day (TID) | ORAL | 0 refills | Status: AC | PRN

## 2020-06-11 NOTE — ED Provider Notes (Signed)
[facility]-EMERGENCY DEPT Provider Note   CSN: [id number] Arrival date & time: 06/11/20  1211     History Chief Complaint  Patient presents with  . Nausea    Brittany [name] is a 20 y.o. female.  HPI      Brittany [name] is a 20 y.o. female, with a history of bipolar, obesity, presenting to the ED with overall feeling poorly along with nausea that she states has been recurrent over the last couple weeks. She does not give any specific complaints beyond this, though she does talk about a vague discomfort along her lower ribs bilaterally "for a while." Denies use of alcohol or illicit drugs.  Denies changes in medications. Denies fever/chills, chest pain, shortness of breath, cough, abdominal pain, vomiting, diarrhea, hematochezia/melena, urinary symptoms, syncope, or any other complaints.    Past Medical History:  Diagnosis Date  . ADHD (attention deficit hyperactivity disorder)   . Attention deficit hyperactivity disorder (ADHD) 08/01/2015  . Bipolar 1 disorder (HCC)   . Bipolar disorder (HCC)   . Depression   . Depressive disorder 08/01/2015  . Headache   . Obesity   . Seasonal allergies     Patient Active Problem List   Diagnosis Date Noted  . Defiant behavior 07/06/2018  . Attention-deficit hyperactivity disorder, predominantly hyperactive type   . Homicidal ideation   . Attention deficit hyperactivity disorder (ADHD) 08/01/2015  . Depressive disorder 08/01/2015  . DMDD (disruptive mood dysregulation disorder) (HCC) 07/26/2015  . Tension headache 05/23/2014  . Depression 05/23/2014  . Anxiety state 05/23/2014    Past Surgical History:  Procedure Laterality Date  . NO PAST SURGERIES       OB History   No obstetric history on file.     Family History  Adopted: Yes  Problem Relation Age of Onset  . HIV Mother        Died at 7    Social History   Tobacco Use  . Smoking status: Never Smoker  . Smokeless tobacco: Never Used   Vaping Use  . Vaping Use: Never used  Substance Use Topics  . Alcohol use: No    Alcohol/week: 0.0 standard drinks  . Drug use: No    Home Medications Prior to Admission medications   Medication Sig Start Date End Date Taking? Authorizing Provider  ARIPiprazole (ABILIFY) 20 MG tablet Take 20 mg by mouth at bedtime.  04/29/18   [provider]  brompheniramine-pseudoephedrine-DM 30-2-10 MG/5ML syrup Take 5-10 mLs by mouth 4 (four) times daily as needed. 04/27/20   Wieters, Hallie C, PA-C  ibuprofen (ADVIL) 800 MG tablet Take 1 tablet (800 mg total) by mouth 3 (three) times daily. 04/27/20   Wieters, Hallie C, PA-C  lamoTRIgine (LAMICTAL) 25 MG tablet Take 50 mg by mouth daily.  06/07/19   [provider]  omeprazole (PRILOSEC) 10 MG capsule Take 10 mg by mouth daily.    [provider]  QUEtiapine (SEROQUEL) 100 MG tablet Take 100 mg by mouth at bedtime.  06/06/19   [provider]    Allergies    Patient has no known allergies.  Review of Systems   Review of Systems  Constitutional: Negative for chills, diaphoresis and fever.  HENT: Negative for trouble swallowing.   Respiratory: Negative for cough and shortness of breath.   Cardiovascular: Negative for chest pain.  Gastrointestinal: Positive for nausea. Negative for abdominal pain, blood in stool, diarrhea and vomiting.  Genitourinary: Negative for difficulty urinating and  dysuria.  Musculoskeletal: Negative for back pain.  Neurological: Negative for syncope, weakness and numbness.  All other systems reviewed and are negative.   Physical Exam Updated Vital Signs BP 128/72 (BP Location: Left Arm)   Pulse 100   Temp 98.9 F (37.2 C) (Oral)   Resp 18   SpO2 99%   Physical Exam Vitals and nursing note reviewed.  Constitutional:      General: She is not in acute distress.    Appearance: She is well-developed. She is not diaphoretic.  HENT:     Head: Normocephalic and atraumatic.      Mouth/Throat:     Mouth: Mucous membranes are moist.     Pharynx: Oropharynx is clear.  Eyes:     Conjunctiva/sclera: Conjunctivae normal.  Cardiovascular:     Rate and Rhythm: Normal rate and regular rhythm.     Pulses: Normal pulses.          Radial pulses are 2+ on the right side and 2+ on the left side.       Posterior tibial pulses are 2+ on the right side and 2+ on the left side.     Heart sounds: Normal heart sounds.     Comments: Tactile temperature in the extremities appropriate and equal bilaterally. Pulmonary:     Effort: Pulmonary effort is normal. No respiratory distress.     Breath sounds: Normal breath sounds.  Abdominal:     Palpations: Abdomen is soft.     Tenderness: There is no abdominal tenderness. There is no guarding.  Musculoskeletal:     Cervical back: Neck supple.     Right lower leg: No edema.     Left lower leg: No edema.  Lymphadenopathy:     Cervical: No cervical adenopathy.  Skin:    General: Skin is warm and dry.  Neurological:     Mental Status: She is alert.     Comments: No noted acute cognitive deficit. Sensation grossly intact to light touch in the extremities.   Grip strengths equal bilaterally.   Strength 4/5 in all extremities, but equal bilaterally.  Coordination intact.  Cranial nerves III-XII grossly intact.  Handles oral secretions without noted difficulty.  No noted phonation or speech deficit. No facial droop.   Psychiatric:        Mood and Affect: Mood and affect normal.        Speech: Speech normal.        Behavior: Behavior normal.     ED Results / Procedures / Treatments   Labs (all labs ordered are listed, but only abnormal results are displayed) Labs Reviewed - No data to display  EKG None  Radiology DG Chest 2 View  Result Date: 06/11/2020 CLINICAL DATA:  Lethargy. EXAM: CHEST - 2 VIEW COMPARISON:  March 16, 2020 FINDINGS: Decreased lung volumes are seen which is likely secondary to the degree of patient  inspiration. Mild bibasilar crowding of the bronchovascular lung markings is seen. There is no evidence of a pleural effusion or pneumothorax. The heart size and mediastinal contours are within normal limits. Bilateral radiopaque nipple piercings are present. The visualized skeletal structures are unremarkable. IMPRESSION: Low lung volumes without acute or active cardiopulmonary disease. Electronically Signed   By: Aram Candela M.D.   On: 06/11/2020 14:42    Procedures Procedures   Medications Ordered in ED Medications  sodium chloride 0.9 % bolus 1,000 mL (1,000 mLs Intravenous New Bag/Given 06/11/20 1443)  ondansetron (ZOFRAN) injection 4 mg (4 mg  Intravenous Given 06/11/20 1442)    ED Course  I have reviewed the triage vital signs and the nursing notes.  Pertinent labs & imaging results that were available during my care of the patient were reviewed by me and considered in my medical decision making (see chart for details).    MDM Rules/Calculators/A&P                          Patient presents with overall feeling poorly along with nausea. Patient is nontoxic appearing, afebrile, not tachycardic, not tachypneic, not hypotensive, maintains excellent SPO2 on room air, and is in no apparent distress.   Patient care handoff report given Byrd Hesselbach [facility]) Vernia Buff, PA-C. Plan: Lab results pending.  Reexamine, ambulate.   Final Clinical Impression(s) / ED Diagnoses Final diagnoses:  None    Rx / DC Orders ED Discharge Orders    None       Concepcion Living 06/11/20 1536    Jacalyn Lefevre, MD 06/12/20 [phone]

## 2020-06-11 NOTE — ED Notes (Signed)
Patient ambulated to the bathroom. Other than reporting a little lightheadedness, the patient tolerated ambulation well.

## 2020-06-11 NOTE — ED Triage Notes (Signed)
Pt presents from home with c/o nausea. Pt felt like she was going to have a syncopal episode, no LOC. Pt not able to answer questions appropriately for EMS. Pt also has not been taking her regular medication for her employer.

## 2020-06-11 NOTE — ED Provider Notes (Signed)
Care of the patient received from Minda Ditto, PA-C.  Please see his note for full HPI.  In short, 20 year old female with a history of bipolar, obesity presents to the ER with episodes of feeling poorly and nausea over the last couple weeks.  History is limited as she will not really elaborate on history.  She also reports some vague symptoms and pain in her ribs.  Care of the patient received pending lab work, chest x-ray, she was given 1 L of fluids and Zofran.  I personally reviewed and interpreted her lab work, which was overall reassuring. CBC without leukocytosis, hemoglobin 11.7.  CMP without any significant electrolyte abnormalities, normal renal and liver function test. Lipase normal, negative pregnancy test, negative ethanol, UA without evidence of UTI.  Chest x-ray with low lung volumes, but no signs of infection.  She does have some THC on board per UDS could be contributing to her symptoms of nausea and not feeling well.  On reevaluation, patient notes no significant improvement in her symptoms, though she is asking for a sandwich and cranberry juice.  She was ambulated by nursing staff with normal steady gait, no evidence of weakness or hypoxia or dizziness.  She did have a positive Covid test in January, question long Covid/sequela from this.  She has no evidence of tachycardia, hypoxia, low suspicion for PE at this time.  No fever here, low suspicion for sepsis.  She denies any recent medication changes.  She has no abdominal tenderness on exam.   At this time, I do not feel there is any life-threatening condition present. I have reviewed and discussed all results ( imaging, lab, urine as appropriate), exam findings with patient. I have reviewed nursing notes and appropriate previous records.  I feel the patient is safe to be discharged home without further emergent workup. Discussed usual and customary return precautions. Patient and family (if present) verbalize understanding and are  comfortable with this plan.  Patient will follow-up with their primary care provider. If they do not have a primary care provider, information for follow-up has been provided to them.   All questions have been answered. Portions of this chart were dictated utilizing voice recognition software.  Despite best efforts to proofread,  errors can occur which can change the meaning of documentation.   Physical Exam  BP (!) 105/53   Pulse 86   Temp 98.9 F (37.2 C) (Oral)   Resp 16   SpO2 99%   Physical Exam Vitals and nursing note reviewed.  Constitutional:      General: She is not in acute distress.    Appearance: She is well-developed and well-nourished.  HENT:     Head: Normocephalic and atraumatic.  Eyes:     Conjunctiva/sclera: Conjunctivae normal.  Cardiovascular:     Rate and Rhythm: Normal rate and regular rhythm.     Heart sounds: No murmur heard.   Pulmonary:     Effort: Pulmonary effort is normal. No respiratory distress.     Breath sounds: Normal breath sounds.  Abdominal:     Palpations: Abdomen is soft.     Tenderness: There is no abdominal tenderness.  Musculoskeletal:        General: No edema.     Cervical back: Neck supple.  Skin:    General: Skin is warm and dry.  Neurological:     Mental Status: She is alert.  Psychiatric:        Mood and Affect: Mood and affect normal.  ED Course/Procedures    Results for orders placed or performed during the hospital encounter of 06/11/20  Urine rapid drug screen (hosp performed)  Result Value Ref Range   Opiates NONE DETECTED NONE DETECTED   Cocaine NONE DETECTED NONE DETECTED   Benzodiazepines NONE DETECTED NONE DETECTED   Amphetamines NONE DETECTED NONE DETECTED   Tetrahydrocannabinol POSITIVE (A) NONE DETECTED   Barbiturates NONE DETECTED NONE DETECTED  Urinalysis, Routine w reflex microscopic  Result Value Ref Range   Color, Urine YELLOW YELLOW   APPearance HAZY (A) CLEAR   Specific Gravity, Urine 1.020  1.005 - 1.030   pH 7.0 5.0 - 8.0   Glucose, UA NEGATIVE NEGATIVE mg/dL   Hgb urine dipstick NEGATIVE NEGATIVE   Bilirubin Urine NEGATIVE NEGATIVE   Ketones, ur NEGATIVE NEGATIVE mg/dL   Protein, ur NEGATIVE NEGATIVE mg/dL   Nitrite NEGATIVE NEGATIVE   Leukocytes,Ua NEGATIVE NEGATIVE  Comprehensive metabolic panel  Result Value Ref Range   Sodium 144 135 - 145 mmol/L   Potassium 3.9 3.5 - 5.1 mmol/L   Chloride 112 (H) 98 - 111 mmol/L   CO2 23 22 - 32 mmol/L   Glucose, Bld 128 (H) 70 - 99 mg/dL   BUN 10 6 - 20 mg/dL   Creatinine, Ser 1.61 0.44 - 1.00 mg/dL   Calcium 8.6 (L) 8.9 - 10.3 mg/dL   Total Protein 6.5 6.5 - 8.1 g/dL   Albumin 3.5 3.5 - 5.0 g/dL   AST 14 (L) 15 - 41 U/L   ALT 14 0 - 44 U/L   Alkaline Phosphatase 63 38 - 126 U/L   Total Bilirubin 0.5 0.3 - 1.2 mg/dL   GFR, Estimated >09 >60 mL/min   Anion gap 9 5 - 15  Ethanol  Result Value Ref Range   Alcohol, Ethyl (B) <10 <10 mg/dL  Lipase, blood  Result Value Ref Range   Lipase 38 11 - 51 U/L  CBC with Differential  Result Value Ref Range   WBC 6.9 4.0 - 10.5 K/uL   RBC 4.25 3.87 - 5.11 MIL/uL   Hemoglobin 11.7 (L) 12.0 - 15.0 g/dL   HCT 45.4 09.8 - 11.9 %   MCV 88.2 80.0 - 100.0 fL   MCH 27.5 26.0 - 34.0 pg   MCHC 31.2 30.0 - 36.0 g/dL   RDW 14.7 (H) 82.9 - 56.2 %   Platelets 287 150 - 400 K/uL   nRBC 0.0 0.0 - 0.2 %   Neutrophils Relative % 57 %   Neutro Abs 4.0 1.7 - 7.7 K/uL   Lymphocytes Relative 34 %   Lymphs Abs 2.3 0.7 - 4.0 K/uL   Monocytes Relative 8 %   Monocytes Absolute 0.5 0.1 - 1.0 K/uL   Eosinophils Relative 0 %   Eosinophils Absolute 0.0 0.0 - 0.5 K/uL   Basophils Relative 1 %   Basophils Absolute 0.0 0.0 - 0.1 K/uL   Immature Granulocytes 0 %   Abs Immature Granulocytes 0.02 0.00 - 0.07 K/uL  I-Stat beta hCG blood, ED  Result Value Ref Range   I-stat hCG, quantitative <5.0 <5 mIU/mL   Comment 3           DG Chest 2 View  Result Date: 06/11/2020 CLINICAL DATA:  Lethargy. EXAM:  CHEST - 2 VIEW COMPARISON:  March 16, 2020 FINDINGS: Decreased lung volumes are seen which is likely secondary to the degree of patient inspiration. Mild bibasilar crowding of the bronchovascular lung markings is seen. There is no evidence  of a pleural effusion or pneumothorax. The heart size and mediastinal contours are within normal limits. Bilateral radiopaque nipple piercings are present. The visualized skeletal structures are unremarkable. IMPRESSION: Low lung volumes without acute or active cardiopulmonary disease. Electronically Signed   By: Aram Candela M.D.   On: 06/11/2020 14:42    Procedures  MDM         Mare Ferrari, PA-C 06/11/20 1740    Bethann Berkshire, MD 06/12/20 2308

## 2020-06-11 NOTE — Discharge Instructions (Signed)
Your work-up today was overall reassuring.  Your labs did not show any significant abnormalities.  Please try to limit your use of marijuana as this may be contributing to your symptoms.  You also may be having some post Covid complications, though there is nothing emergent about them at this time.  Please follow-up with your primary care doctor, if you do not have 1, please call the phone number in your discharge paperwork in order to establish with 1.  I am sending you home with some nausea medicine, take this as needed.  Please make sure to drink plenty of fluids.  Return to the ER for any new or worsening symptoms.

## 2020-06-11 NOTE — ED Notes (Signed)
Pt aware urine sample is needed 

## 2020-06-26 ENCOUNTER — Other Ambulatory Visit: Payer: Self-pay

## 2020-06-26 ENCOUNTER — Ambulatory Visit
Admission: EM | Admit: 2020-06-26 | Discharge: 2020-06-26 | Disposition: A | Discharge status: Home / Self Care | Payer: Medicare Other | Attending: Emergency Medicine | Admitting: Emergency Medicine

## 2020-06-26 ENCOUNTER — Encounter: Payer: Self-pay | Admitting: Emergency Medicine

## 2020-06-26 DIAGNOSIS — J029 Acute pharyngitis, unspecified: Secondary | ICD-10-CM | POA: Diagnosis present

## 2020-06-26 LAB — POCT RAPID STREP A (OFFICE): Rapid Strep A Screen: NEGATIVE

## 2020-06-26 LAB — POCT MONO SCREEN (KUC): Mono, POC: NEGATIVE

## 2020-06-26 MED ORDER — DEXAMETHASONE SODIUM PHOSPHATE 10 MG/ML IJ SOLN
10.0000 mg | Freq: Once | INTRAMUSCULAR | Status: AC
  Administered 2020-06-26: 10 mg via INTRAMUSCULAR

## 2020-06-26 MED ORDER — IBUPROFEN 600 MG PO TABS: 600.0000 mg | ORAL_TABLET | Freq: Four times a day (QID) | ORAL | 0 refills | Status: AC | PRN

## 2020-06-26 NOTE — ED Provider Notes (Signed)
HPI  SUBJECTIVE:  Patient reports sore throat starting 6 days ago. Sx worse with swallowing, talking.  Sx better with nothing. Has been taking 800 mg of ibuprofen once daily w/ o relief.  No fevers + Has noted "white patches" on tonsils   No neck stiffness  No Cough + nasal congestion, rhinorrhea + Myalgias No Headache No Rash  No loss of taste or smell, but states that her taste is altered No shortness of breath or difficulty breathing No nausea, vomiting No diarrhea No abdominal pain     No Recent Strep, mono, COVID exposure No reflux sxs No Allergy sxs  No Breathing difficulty, voice changes, sensation of throat swelling shut No Drooling No Trismus No abx in past month. All immunizations UTD.  No antipyretic in past 4-6 hrs Patient got the second Moderna vaccine in April 21.  Past medical history negative for frequent strep, mono, diabetes, hypertension, GERD, allergy LMP: She has an IUD.  Denies possibility of being pregnant PMD: None   Past Medical History:  Diagnosis Date  . ADHD (attention deficit hyperactivity disorder)   . Attention deficit hyperactivity disorder (ADHD) 08/01/2015  . Bipolar 1 disorder (HCC)   . Bipolar disorder (HCC)   . Depression   . Depressive disorder 08/01/2015  . Headache   . Obesity   . Seasonal allergies     Past Surgical History:  Procedure Laterality Date  . NO PAST SURGERIES      Family History  Adopted: Yes  Problem Relation Age of Onset  . HIV Mother        Died at 43    Social History   Tobacco Use  . Smoking status: Never Smoker  . Smokeless tobacco: Never Used  Vaping Use  . Vaping Use: Never used  Substance Use Topics  . Alcohol use: No    Alcohol/week: 0.0 standard drinks  . Drug use: No    No current facility-administered medications for this encounter.  Current Outpatient Medications:  .  ibuprofen (ADVIL) 600 MG tablet, Take 1 tablet (600 mg total) by mouth every 6 (six) hours as needed., Disp:  30 tablet, Rfl: 0 .  ARIPiprazole (ABILIFY) 20 MG tablet, Take 20 mg by mouth at bedtime. , Disp: , Rfl:  .  lamoTRIgine (LAMICTAL) 25 MG tablet, Take 50 mg by mouth daily. , Disp: , Rfl:  .  omeprazole (PRILOSEC) 10 MG capsule, Take 10 mg by mouth daily., Disp: , Rfl:  .  ondansetron (ZOFRAN ODT) 4 MG disintegrating tablet, Take 1 tablet (4 mg total) by mouth every 8 (eight) hours as needed for nausea or vomiting., Disp: 20 tablet, Rfl: 0 .  QUEtiapine (SEROQUEL) 100 MG tablet, Take 100 mg by mouth at bedtime. , Disp: , Rfl:   No Known Allergies   ROS  As noted in HPI.   Physical Exam  BP 125/80 (BP Location: Left Arm)   Pulse 70   Temp 98.8 F (37.1 C) (Oral)   Resp 18   SpO2 96%   Constitutional: Well developed, well nourished, no acute distress Eyes:  EOMI, conjunctiva normal bilaterally HENT: Normocephalic, atraumatic,mucus membranes moist. + Mild nasal congestion +erythematous oropharynx +enlarged, almost kissing tonsils +  exudates. Uvula midline.  No muffled voice, drooling, trismus, neck stiffness Respiratory: Normal inspiratory effort Cardiovascular: Normal rate, no murmurs, rubs, gallops GI: nondistended, positive left upper quadrant tenderness. No appreciable splenomegaly skin: No rash, skin intact Lymph:  + Anterior cervical LN.  No posterior cervical lymphadenopathy Musculoskeletal: no  deformities Neurologic: Alert & oriented x 3, no focal neuro deficits Psychiatric: Speech and behavior appropriate.  ED Course   Medications  dexamethasone (DECADRON) injection 10 mg (10 mg Intramuscular Given 06/26/20 1813)    Orders Placed This Encounter  Procedures  . Culture, group A strep    Standing Status:   Standing    Number of Occurrences:   1  . POCT rapid strep A    Standing Status:   Standing    Number of Occurrences:   1  . POCT mono screen    Standing Status:   Standing    Number of Occurrences:   1    Results for orders placed or performed during the  hospital encounter of 06/26/20 (from the past 24 hour(s))  POCT rapid strep A     Status: None   Collection Time: 06/26/20  5:17 PM  Result Value Ref Range   Rapid Strep A Screen Negative Negative  POCT mono screen     Status: None   Collection Time: 06/26/20  6:03 PM  Result Value Ref Range   Mono, POC Negative Negative   No results found.  ED Clinical Impression  1. Acute pharyngitis, unspecified etiology      ED Assessment/Plan   Mono, rapid strep negative. Obtaining throat culture to guide antibiotic treatment. Discussed this with patient. We'll contact her if culture is positive, and will call in Appropriate antibiotics. Patient home with ibuprofen, Tylenol, Benadryl/Maalox mixture.. Patient to followup with PMD when necessary, will refer to local primary care resources.  Discussed labs,  MDM, plan and followup with patient. Discussed sn/sx that should prompt return to the ED. patient agrees with plan.   Meds ordered this encounter  Medications  . dexamethasone (DECADRON) injection 10 mg  . ibuprofen (ADVIL) 600 MG tablet    Sig: Take 1 tablet (600 mg total) by mouth every 6 (six) hours as needed.    Dispense:  30 tablet    Refill:  0     *This clinic note was created using Scientist, clinical (histocompatibility and immunogenetics). Therefore, there may be occasional mistakes despite careful proofreading.     Domenick Gong, MD 06/27/20 [phone]

## 2020-06-26 NOTE — Discharge Instructions (Signed)
your rapid strep and mono were both negative today, so we have sent off a throat culture.  We will contact you and call in the appropriate antibiotics if your culture comes back positive for an infection requiring antibiotic treatment.  Give [place] a working phone number.  1 gram of Tylenol and 600 mg ibuprofen together 3-4 times a day as needed for pain.  Make sure you drink plenty of extra fluids.  Some people find salt water gargles and  Traditional Medicinal's "Throat Coat" tea helpful. Take 5 mL of liquid Benadryl and 5 mL of Maalox. Mix it together, and then hold it in your mouth for as long as you can and then swallow. You may do this 4 times a day.    Below is a list of primary care practices who are taking new patients for you to follow-up with.  [facility] internal medicine clinic Ground Floor - [facility], [address] [place], [place], [place] [address] [phone]  [facility] at [facility] [address] [place], [place] [address] [phone]  [facility] [address] [place], [place] [address] [phone]  [facility] [phone] [address] [place] [address]  [facility]: [address] [place] [address]  [phone]  [facility]: [address] [place] [address]   [phone]  [facility]: [address] [place] [address]  [phone]  [facility] : [address] [place] [address] [phone]  [facility]: [address] [place] [address] [phone]  [facility]: [address] [place] [address] [phone]  Dr. Oneal Grout [address] [facility] [place] [address]  [phone]  Dr. Jackie Plum, [facility]. [address] [place], [place] [address]  [phone]  Go to www.goodrx.com to look up your medications. This will give you a list of where you can find your prescriptions at the most affordable prices. Or ask the pharmacist what the cash price is, or if they have any other discount programs available to help make your medication more affordable. This can be less expensive than what you would pay with insurance.

## 2020-06-26 NOTE — ED Triage Notes (Signed)
Pt sts sore throat x 6 days; denies fever

## 2020-06-29 ENCOUNTER — Telehealth (HOSPITAL_COMMUNITY): Payer: Self-pay | Admitting: Emergency Medicine

## 2020-06-29 LAB — CULTURE, GROUP A STREP (THRC)

## 2020-06-29 MED ORDER — PENICILLIN V POTASSIUM 500 MG PO TABS: 500.0000 mg | ORAL_TABLET | Freq: Two times a day (BID) | ORAL | 0 refills | Status: AC

## 2020-07-02 ENCOUNTER — Ambulatory Visit (HOSPITAL_COMMUNITY)
Admission: EM | Admit: 2020-07-02 | Discharge: 2020-07-02 | Disposition: A | Discharge status: Home / Self Care | Payer: Medicare Other | Attending: Nurse Practitioner | Admitting: Nurse Practitioner

## 2020-07-02 ENCOUNTER — Other Ambulatory Visit: Payer: Self-pay

## 2020-07-02 DIAGNOSIS — F39 Unspecified mood [affective] disorder: Secondary | ICD-10-CM | POA: Diagnosis present

## 2020-07-02 NOTE — ED Provider Notes (Signed)
[facility]al Health Urgent Care Medical Screening Exam  Patient Name: Brittany [name] MRN: [id number] Date of Evaluation: 07/02/20 Chief Complaint:   Diagnosis:  Final diagnoses:  Unspecified mood (affective) disorder (HCC)    History of Present illness: Brittany [name] is a 20 y.o. female who presents voluntarily to [facility]. Patient reports that she has been feeling more depressed and anxious. She states that she contacted the [facility] crisis line because she wanted to reestablish care. She reports that she has Bipolar Disorder but has not taken medications in several months. States that her most recent psychiatric provider was in [place]. States that she now lives in [place] and is looking for a provider in [place]. She denies suicidal ideations. Denies a history of suicide attempts. Denies homicidal ideations. Denies auditory and visual hallucinations. No indication that she is responding to internal stimuli. States that she feels safe returning home and can contract for safety. Denies any medical related concerns.   Psychiatric Specialty Exam  Presentation  General Appearance:Appropriate for Environment; Neat  Eye Contact:Good  Speech:Clear and Coherent; Normal Rate  Speech Volume:Normal  Handedness:Right   Mood and Affect  Mood:Anxious; Depressed  Affect:Congruent   Thought Process  Thought Processes:Coherent; Goal Directed; Linear  Descriptions of Associations:Intact  Orientation:Full (Time, Place and Person)  Thought Content:Logical    Hallucinations:None  Ideas of Reference:None  Suicidal Thoughts:No  Homicidal Thoughts:No   Sensorium  Memory:Immediate Good; Recent Good  Judgment:Fair  Insight:Fair   Executive Functions  Concentration:Good  Attention Span:Good  Recall:Good  Fund of Knowledge:Good  Language:Good   Psychomotor Activity  Psychomotor Activity:Normal   Assets  Assets:Communication Skills; Desire for Improvement;  Housing; Physical Health; Social Support   Sleep  Sleep:Fair  Number of hours: No data recorded  No data recorded  Physical Exam: Physical Exam Constitutional:      General: She is not in acute distress.    Appearance: She is not ill-appearing, toxic-appearing or diaphoretic.  HENT:     Head: Normocephalic.     Right Ear: External ear normal.     Left Ear: External ear normal.  Eyes:     Conjunctiva/sclera: Conjunctivae normal.     Pupils: Pupils are equal, round, and reactive to light.  Cardiovascular:     Rate and Rhythm: Normal rate.  Pulmonary:     Effort: Pulmonary effort is normal. No respiratory distress.  Musculoskeletal:        General: Normal range of motion.  Skin:    General: Skin is warm and dry.  Neurological:     Mental Status: She is alert and oriented to person, place, and time.  Psychiatric:        Mood and Affect: Mood is anxious and depressed.        Thought Content: Thought content is not paranoid or delusional. Thought content does not include homicidal or suicidal ideation.    Review of Systems  Constitutional: Negative for chills, diaphoresis, fever, malaise/fatigue and weight loss.  HENT: Negative for congestion.   Respiratory: Negative for cough and shortness of breath.   Cardiovascular: Negative for chest pain and palpitations.  Gastrointestinal: Negative for diarrhea, nausea and vomiting.  Neurological: Negative for dizziness and seizures.  Psychiatric/Behavioral: Positive for depression and suicidal ideas. Negative for hallucinations, memory loss and substance abuse. The patient is nervous/anxious and has insomnia.   All other systems reviewed and are negative.  Blood pressure 127/72, pulse 84, temperature 99.3 F (37.4 C), resp. rate 18, SpO2 100 %. There is no height  or weight on file to calculate BMI.  Musculoskeletal: Strength & Muscle Tone: within normal limits Gait & Station: normal Patient leans: N/A   [facility] MSE Discharge  Disposition for Follow up and Recommendations: Based on my evaluation the patient does not appear to have an emergency medical condition and can be discharged with resources and follow up care in outpatient services for Medication Management and Individual Therapy   Recommend following up at [facility] during open acces hours to restart medications. Patient in agreement.   Jackelyn Poling, NP 07/02/2020, 8:30 PM

## 2020-07-02 NOTE — Discharge Instructions (Addendum)
Recommend returning to [facility] during walk-in hours (7:30 am Monday to Thursday) to restart medications.  Discharge recommendations:  Patient is to take medications as prescribed. Please see information for follow-up appointment with psychiatry and therapy. Please follow up with your primary care provider for all medical related needs.   Therapy: We recommend that patient participate in individual therapy to address mental health concerns.  Atypical antipsychotics: If you are prescribed an atypical antipsychotic, it is recommended that your height, weight, BMI, blood pressure, fasting lipid panel, and fasting blood sugar be monitored by your outpatient providers.  Safety:  The patient should abstain from use of illicit substances/drugs and abuse of any medications. If symptoms worsen or do not continue to improve or if the patient becomes actively suicidal or homicidal then it is recommended that the patient return to the closest hospital emergency department, the [facility], or call 911 for further evaluation and treatment. National Suicide Prevention Lifeline 1-800-SUICIDE or (276) 489-9910.

## 2020-07-02 NOTE — BH Assessment (Addendum)
Brittany [name] is a 20 year old female presenting voluntarily to [facility] requesting medication management. Patient denied SI, HI and psychosis. Patient reported calling [facility] line for assisting with medication management due to increased anxiety and not feeling that medications were working properly. Patient reported seeing "Mrs. Turkey", psychiatrist in [place] prior to moving to [place]. Patient is currently looking for new provider.  Patient given resources and has agreed to follow up with outpatient services in the morning. Patient reported feeling safe returning home and can contract for safety. Routine

## 2020-07-06 ENCOUNTER — Ambulatory Visit: Payer: Medicare Other | Admitting: Obstetrics

## 2020-08-23 ENCOUNTER — Encounter: Payer: Self-pay | Admitting: Obstetrics

## 2020-08-23 ENCOUNTER — Other Ambulatory Visit: Payer: Self-pay

## 2020-08-23 ENCOUNTER — Ambulatory Visit (INDEPENDENT_AMBULATORY_CARE_PROVIDER_SITE_OTHER): Payer: Medicare Other | Admitting: Obstetrics

## 2020-08-23 VITALS — BP 123/77 | HR 98 | Wt 169.0 lb

## 2020-08-23 DIAGNOSIS — Z30011 Encounter for initial prescription of contraceptive pills: Secondary | ICD-10-CM

## 2020-08-23 DIAGNOSIS — Z3009 Encounter for other general counseling and advice on contraception: Secondary | ICD-10-CM | POA: Diagnosis not present

## 2020-08-23 DIAGNOSIS — Z30432 Encounter for removal of intrauterine contraceptive device: Secondary | ICD-10-CM | POA: Diagnosis not present

## 2020-08-23 MED ORDER — NORETHIN ACE-ETH ESTRAD-FE 1-20 MG-MCG(24) PO TABS: 1.0000 | ORAL_TABLET | Freq: Every day | ORAL | 11 refills | Status: AC

## 2020-08-23 NOTE — Progress Notes (Signed)
    GYNECOLOGY OFFICE PROCEDURE NOTE  Brittany [name] is a 20 y.o. No obstetric history on file. here for Liletta IUD removal because of irregular vaginal bleeding. . No other GYN concerns.     IUD Removal  Patient identified, informed consent performed, consent signed.  Patient was in the dorsal lithotomy position, normal external genitalia was noted.  A speculum was placed in the patient's vagina, normal discharge was noted, no lesions. The cervix was visualized, no lesions, no abnormal discharge.  The strings of the IUD were grasped and pulled using long dressing forceps. The IUD was removed in its entirety.  Patient tolerated the procedure well.    Patient will use OCP's for contraception.  Routine preventative health maintenance measures emphasized.   Brock Bad, MD, FACOG Obstetrician & Gynecologist, [facility] for [facility], [facility] 08/23/20

## 2020-08-23 NOTE — Progress Notes (Signed)
RGYN pt presents for IUD removal.  Inserted : 08/05/19 by Dr.Pratt   CC: pt notes irregular bleeding.  Pt considering going back to birth control pills.

## 2020-08-23 NOTE — Progress Notes (Signed)
Subjective:    Brittany [name] is a 20 y.o. female who presents for contraception counseling after removal of IUD. The patient has had IUD removed because of irregular vaginal bleeding.  Wants to start OCP's. The patient is sexually active. Pertinent past medical history: none.  The information documented in the HPI was reviewed and verified.  Menstrual History: OB History   No obstetric history on file.      No LMP recorded. (Menstrual status: IUD).   Patient Active Problem List   Diagnosis Date Noted  . Defiant behavior 07/06/2018  . Attention-deficit hyperactivity disorder, predominantly hyperactive type   . Homicidal ideation   . Attention deficit hyperactivity disorder (ADHD) 08/01/2015  . Depressive disorder 08/01/2015  . DMDD (disruptive mood dysregulation disorder) (HCC) 07/26/2015  . Tension headache 05/23/2014  . Depression 05/23/2014  . Anxiety state 05/23/2014   Past Medical History:  Diagnosis Date  . ADHD (attention deficit hyperactivity disorder)   . Attention deficit hyperactivity disorder (ADHD) 08/01/2015  . Bipolar 1 disorder (HCC)   . Bipolar disorder (HCC)   . Depression   . Depressive disorder 08/01/2015  . Headache   . Obesity   . Seasonal allergies     Past Surgical History:  Procedure Laterality Date  . NO PAST SURGERIES       Current Outpatient Medications:  .  ARIPiprazole (ABILIFY) 20 MG tablet, Take 20 mg by mouth at bedtime. , Disp: , Rfl:  .  ibuprofen (ADVIL) 600 MG tablet, Take 1 tablet (600 mg total) by mouth every 6 (six) hours as needed., Disp: 30 tablet, Rfl: 0 .  lamoTRIgine (LAMICTAL) 25 MG tablet, Take 50 mg by mouth daily. , Disp: , Rfl:  .  omeprazole (PRILOSEC) 10 MG capsule, Take 10 mg by mouth daily., Disp: , Rfl:  .  ondansetron (ZOFRAN ODT) 4 MG disintegrating tablet, Take 1 tablet (4 mg total) by mouth every 8 (eight) hours as needed for nausea or vomiting., Disp: 20 tablet, Rfl: 0 .  QUEtiapine (SEROQUEL) 100 MG tablet,  Take 100 mg by mouth at bedtime. , Disp: , Rfl:  No Known Allergies  Social History   Tobacco Use  . Smoking status: Never Smoker  . Smokeless tobacco: Never Used  Substance Use Topics  . Alcohol use: No    Alcohol/week: 0.0 standard drinks    Family History  Adopted: Yes  Problem Relation Age of Onset  . HIV Mother        Died at 63       Review of Systems Constitutional: negative for weight loss Genitourinary:negative for abnormal menstrual periods and vaginal discharge   Objective:   BP 123/77   Pulse 98   Wt 169 lb (76.7 kg)   BMI 30.91 kg/m    General:   alert and no distress  Skin:   no rash or abnormalities  Lungs:   clear to auscultation bilaterally  Heart:   regular rate and rhythm, S1, S2 normal, no murmur, click, rub or gallop  Breasts:   not examined  Abdomen:  normal findings: no organomegaly, soft, non-tender and no hernia  Pelvis:  External genitalia: normal general appearance Urinary system: urethral meatus normal and bladder without fullness, nontender Vaginal: normal without tenderness, induration or masses Cervix: normal appearance Adnexa: normal bimanual exam Uterus: anteverted and non-tender, normal size   Lab Review Urine pregnancy test Labs reviewed yes Radiologic studies reviewed no  I have spent a total of 20 minutes of face-to-face time, excluding  clinical staff time, reviewing notes and preparing to see patient, ordering tests and/or medications, and counseling the patient.  Assessment:    20 y.o., starting OCP (estrogen/progesterone), no contraindications.   1. Encounter for IUD removal - IUD removed, intact  2. Encounter for counseling regarding contraception - wants OCP's  3. Encounter for initial prescription of contraceptive pills Rx: - Norethindrone Acetate-Ethinyl Estrad-FE (LOESTRIN 24 FE) 1-20 MG-MCG(24) tablet; Take 1 tablet by mouth daily.  Dispense: 28 tablet; Refill: 11   Plan:    All questions  answered. Discussed healthy lifestyle modifications. Follow up in 6 months.   Brock Bad, MD 08/23/2020 3:48 PM

## 2021-01-05 DEATH — deceased
